# Patient Record
Sex: Male | Born: 1940 | Race: White | Hispanic: No | Smoking: Former smoker
Health system: Southern US, Community
[De-identification: ages and names within clinical notes are randomized; demographics above are authoritative.]

## PROBLEM LIST (undated history)

## (undated) DIAGNOSIS — I10 Essential (primary) hypertension: Secondary | ICD-10-CM

## (undated) DIAGNOSIS — F419 Anxiety disorder, unspecified: Secondary | ICD-10-CM

## (undated) DIAGNOSIS — M545 Low back pain: Secondary | ICD-10-CM

## (undated) DIAGNOSIS — E7800 Pure hypercholesterolemia, unspecified: Secondary | ICD-10-CM

## (undated) DIAGNOSIS — H409 Unspecified glaucoma: Secondary | ICD-10-CM

## (undated) DIAGNOSIS — R569 Unspecified convulsions: Secondary | ICD-10-CM

## (undated) DIAGNOSIS — F1010 Alcohol abuse, uncomplicated: Secondary | ICD-10-CM

## (undated) DIAGNOSIS — F329 Major depressive disorder, single episode, unspecified: Secondary | ICD-10-CM

## (undated) DIAGNOSIS — F32A Depression, unspecified: Secondary | ICD-10-CM

## (undated) DIAGNOSIS — G8929 Other chronic pain: Secondary | ICD-10-CM

## (undated) DIAGNOSIS — K219 Gastro-esophageal reflux disease without esophagitis: Secondary | ICD-10-CM

## (undated) DIAGNOSIS — R413 Other amnesia: Secondary | ICD-10-CM

## (undated) HISTORY — PX: POLYPECTOMY: SHX149

## (undated) HISTORY — PX: GLAUCOMA SURGERY: SHX656

## (undated) HISTORY — PX: TONSILLECTOMY: SUR1361

## (undated) HISTORY — DX: Other amnesia: R41.3

## (undated) HISTORY — PX: CATARACT EXTRACTION W/ INTRAOCULAR LENS  IMPLANT, BILATERAL: SHX1307

## (undated) HISTORY — PX: UPPER GI ENDOSCOPY: SHX6162

---

## 2007-05-09 ENCOUNTER — Emergency Department (HOSPITAL_COMMUNITY): Admission: EM | Admit: 2007-05-09 | Discharge: 2007-05-09 | Payer: Self-pay | Admitting: Emergency Medicine

## 2010-03-21 ENCOUNTER — Encounter: Admission: RE | Admit: 2010-03-21 | Discharge: 2010-03-21 | Payer: Self-pay | Admitting: Family Medicine

## 2014-07-24 ENCOUNTER — Encounter (HOSPITAL_COMMUNITY): Payer: Self-pay | Admitting: Emergency Medicine

## 2014-07-24 ENCOUNTER — Emergency Department (HOSPITAL_COMMUNITY)
Admission: EM | Admit: 2014-07-24 | Discharge: 2014-07-24 | Disposition: A | Discharge status: Home / Self Care | Payer: Medicare Other | Attending: Emergency Medicine | Admitting: Emergency Medicine

## 2014-07-24 DIAGNOSIS — Z8669 Personal history of other diseases of the nervous system and sense organs: Secondary | ICD-10-CM | POA: Insufficient documentation

## 2014-07-24 DIAGNOSIS — M791 Myalgia: Secondary | ICD-10-CM | POA: Insufficient documentation

## 2014-07-24 DIAGNOSIS — I10 Essential (primary) hypertension: Secondary | ICD-10-CM | POA: Diagnosis not present

## 2014-07-24 DIAGNOSIS — K5900 Constipation, unspecified: Secondary | ICD-10-CM | POA: Insufficient documentation

## 2014-07-24 DIAGNOSIS — Z8639 Personal history of other endocrine, nutritional and metabolic disease: Secondary | ICD-10-CM | POA: Insufficient documentation

## 2014-07-24 DIAGNOSIS — B029 Zoster without complications: Secondary | ICD-10-CM | POA: Diagnosis not present

## 2014-07-24 DIAGNOSIS — R21 Rash and other nonspecific skin eruption: Secondary | ICD-10-CM | POA: Diagnosis present

## 2014-07-24 HISTORY — DX: Pure hypercholesterolemia, unspecified: E78.00

## 2014-07-24 HISTORY — DX: Essential (primary) hypertension: I10

## 2014-07-24 HISTORY — DX: Unspecified glaucoma: H40.9

## 2014-07-24 MED ORDER — POLYETHYLENE GLYCOL 3350 17 GM/SCOOP PO POWD: 17.0000 g | Freq: Two times a day (BID) | ORAL | Status: DC

## 2014-07-24 MED ORDER — OXYCODONE-ACETAMINOPHEN 5-325 MG PO TABS: 2.0000 | ORAL_TABLET | Freq: Four times a day (QID) | ORAL | Status: DC | PRN

## 2014-07-24 NOTE — ED Provider Notes (Signed)
CSN: [id number]     Arrival date & time 07/24/14  [id number] History  This chart was scribed for non-physician practitioner, Roxy Horsemanobert Terease Marcotte, PA-C, working with Raeford RazorStephen Kohut, MD by Charline BillsEssence Howell, ED Scribe. This patient was seen in room TR07C/TR07C and the patient's care was started at 9:36 AM.   Chief Complaint  Patient presents with  . Rash   The history is provided by the patient. No language interpreter was used.   HPI Comments: Tommy Jensen is a 73 y.o. male, with a h/o HTN, hypercholesteremia, glaucoma, who presents to the Emergency Department with a chief complaint of gradually improving rash to L leg first noted 1 week ago. He describes the rash as red blisters that have burst. Pt states that rash started a few days after he experienced constant leg pain 10 days ago. Pt reports lack of sleep due to severity of pain. Pt states that he was seen by his PCP who suspected sciatica and prescribed him Hydrocodone which has provided temporary relief. Pt has also tried Extra Strength Tylenol without relief. He also reports constipation; last BM 4-5 days ago. Pt has tried consuming dried prunes daily without relief.   Past Medical History  Diagnosis Date  . Hypertension   . Hypercholesteremia   . Glaucoma    History reviewed. No pertinent past surgical history. History reviewed. No pertinent family history. History  Substance Use Topics  . Smoking status: Never Smoker   . Smokeless tobacco: Not on file  . Alcohol Use: No    Review of Systems  Gastrointestinal: Positive for constipation.  Musculoskeletal: Positive for myalgias.  Skin: Positive for rash.  All other systems reviewed and are negative.  Allergies  Ace inhibitors; Augmentin; Lovastatin; Pravastatin; and Wellbutrin  Home Medications   Prior to Admission medications   Not on File   Triage Vitals: BP 167/104 mmHg  Pulse 100  Temp(Src) 97.7 F (36.5 C) (Oral)  Resp 18  SpO2 99% Physical Exam  Constitutional: He is  oriented to person, place, and time. He appears well-developed and well-nourished. No distress.  HENT:  Head: Normocephalic and atraumatic.  Eyes: Conjunctivae and EOM are normal.  Neck: Neck supple.  Pulmonary/Chest: Effort normal. No respiratory distress.  Musculoskeletal: Normal range of motion.  Neurological: He is alert and oriented to person, place, and time.  Skin: Skin is warm and dry. Rash noted. Rash is vesicular.  Vesicular rash along the L3 dermatome consistent with shingles. Rash has scabbed and crusted over. No weeping or oozing.   Psychiatric: He has a normal mood and affect. His behavior is normal.  Nursing note and vitals reviewed.  ED Course  Procedures (including critical care time) DIAGNOSTIC STUDIES: Oxygen Saturation is 99% on RA, normal by my interpretation.    COORDINATION OF CARE: 9:48 AM-Discussed treatment plan which includes medication for pain. Also suggested getting the shingles vaccine and following up with PCP with pt at bedside and pt agreed to plan.   Labs Review Labs Reviewed - No data to display  Imaging Review No results found.   EKG Interpretation None      MDM   Final diagnoses:  Shingles   Patient with shingles.  Lesions have crusted over.  DC to home with pain meds.  Discussed with Dr. Juleen ChinaKohut, who agrees with the plan. I personally performed the services described in this documentation, which was scribed in my presence. The recorded information has been reviewed and is accurate.    Roxy Horsemanobert Gordon Vandunk, PA-C 07/24/14 1544  Raeford RazorStephen Kohut, MD 07/25/14 [phone]

## 2014-07-24 NOTE — ED Notes (Signed)
Pt c/o rash to left leg x 1 week that was blistered and now improved but still having pain

## 2014-07-24 NOTE — Discharge Instructions (Signed)
Shingles °Shingles (herpes zoster) is an infection that is caused by the same virus that causes chickenpox (varicella). The infection causes a painful skin rash and fluid-filled blisters, which eventually break open, crust over, and heal. It may occur in any area of the body, but it usually affects only one side of the body or face. The pain of shingles usually lasts about 1 month. However, some people with shingles may develop long-term (chronic) pain in the affected area of the body. °Shingles often occurs many years after the person had chickenpox. It is more common: °· In people older than 50 years. °· In people with weakened immune systems, such as those with HIV, AIDS, or cancer. °· In people taking medicines that weaken the immune system, such as transplant medicines. °· In people under great stress. °CAUSES  °Shingles is caused by the varicella zoster virus (VZV), which also causes chickenpox. After a person is infected with the virus, it can remain in the person's body for years in an inactive state (dormant). To cause shingles, the virus reactivates and breaks out as an infection in a nerve root. °The virus can be spread from person to person (contagious) through contact with open blisters of the shingles rash. It will only spread to people who have not had chickenpox. When these people are exposed to the virus, they may develop chickenpox. They will not develop shingles. Once the blisters scab over, the person is no longer contagious and cannot spread the virus to others. °SIGNS AND SYMPTOMS  °Shingles shows up in stages. The initial symptoms may be pain, itching, and tingling in an area of the skin. This pain is usually described as burning, stabbing, or throbbing. In a few days or weeks, a painful red rash will appear in the area where the pain, itching, and tingling were felt. The rash is usually on one side of the body in a band or belt-like pattern. Then, the rash usually turns into fluid-filled  blisters. They will scab over and dry up in approximately 2-3 weeks. °Flu-like symptoms may also occur with the initial symptoms, the rash, or the blisters. These may include: °· Fever. °· Chills. °· Headache. °· Upset stomach. °DIAGNOSIS  °Your health care provider will perform a skin exam to diagnose shingles. Skin scrapings or fluid samples may also be taken from the blisters. This sample will be examined under a microscope or sent to a lab for further testing. °TREATMENT  °There is no specific cure for shingles. Your health care provider will likely prescribe medicines to help you manage the pain, recover faster, and avoid long-term problems. This may include antiviral drugs, anti-inflammatory drugs, and pain medicines. °HOME CARE INSTRUCTIONS  °· Take a cool bath or apply cool compresses to the area of the rash or blisters as directed. This may help with the pain and itching.   °· Take medicines only as directed by your health care provider.   °· Rest as directed by your health care provider. °· Keep your rash and blisters clean with mild soap and cool water or as directed by your health care provider.  °· Do not pick your blisters or scratch your rash. Apply an anti-itch cream or numbing creams to the affected area as directed by your health care provider. °· Keep your shingles rash covered with a loose bandage (dressing). °· Avoid skin contact with: °· Babies.   °· Pregnant women.   °· Children with eczema.   °· Elderly people with transplants.   °· People with chronic illnesses, such as leukemia   or AIDS.   °· Wear loose-fitting clothing to help ease the pain of material rubbing against the rash. °· Keep all follow-up visits as directed by your health care provider. If the area involved is on your face, you may receive a referral for a specialist, such as an eye doctor (ophthalmologist) or an ear, nose, and throat (ENT) doctor. Keeping all follow-up visits will help you avoid eye problems, chronic pain, or  disability.   °SEEK IMMEDIATE MEDICAL CARE IF:  °· You have facial pain, pain around the eye area, or loss of feeling on one side of your face. °· You have ear pain or ringing in your ear. °· You have loss of taste. °· Your pain is not relieved with prescribed medicines.   °· Your redness or swelling spreads.   °· You have more pain and swelling.  °· Your condition is worsening or has changed.   °· You have a fever. °MAKE SURE YOU: °· Understand these instructions. °· Will watch your condition. °· Will get help right away if you are not doing well or get worse. °Document Released: 09/01/2005 Document Revised: 01/16/2014 Document Reviewed: 04/15/2012 °[employer]® Patient Information ©2015 [employer]. This information is not intended to replace advice given to you by your health care provider. Make sure you discuss any questions you have with your health care provider. ° °Neuropathic Pain °We often think that pain has a physical cause. If we get rid of the cause, the pain should go away. Nerves themselves can also cause pain. It is called neuropathic pain, which means nerve abnormality. It may be difficult for the patients who have it and for the treating caregivers. Pain is usually described as acute (short-lived) or chronic (long-lasting). Acute pain is related to the physical sensations caused by an injury. It can last from a few seconds to many weeks, but it usually goes away when normal healing occurs. Chronic pain lasts beyond the typical healing time. With neuropathic pain, the nerve fibers themselves may be damaged or injured. They then send incorrect signals to other pain centers. The pain you feel is real, but the cause is not easy to find.  °CAUSES  °Chronic pain can result from diseases, such as diabetes and shingles (an infection related to chickenpox), or from trauma, surgery, or amputation. It can also happen without any known injury or disease. The nerves are sending pain messages, even though there  is no identifiable cause for such messages.  °· Other common causes of neuropathy include diabetes, phantom limb pain, or Regional Pain Syndrome (RPS). °· As with all forms of chronic back pain, if neuropathy is not correctly treated, there can be a number of associated problems that lead to a downward cycle for the patient. These include depression, sleeplessness, feelings of fear and anxiety, limited social interaction and inability to do normal daily activities or work. °· The most dramatic and mysterious example of neuropathic pain is called "phantom limb syndrome." This occurs when an arm or a leg has been removed because of illness or injury. The brain still gets pain messages from the nerves that originally carried impulses from the missing limb. These nerves now seem to misfire and cause troubling pain. °· Neuropathic pain often seems to have no cause. It responds poorly to standard pain treatment. °Neuropathic pain can occur after: °· Shingles (herpes zoster virus infection). °· A lasting burning sensation of the skin, caused usually by injury to a peripheral nerve. °· Peripheral neuropathy which is widespread nerve damage, often caused by diabetes or   alcoholism. °· Phantom limb pain following an amputation. °· Facial nerve problems (trigeminal neuralgia). °· Multiple sclerosis. °· Reflex sympathetic dystrophy. °· Pain which comes with cancer and cancer chemotherapy. °· Entrapment neuropathy such as when pressure is put on a nerve such as in carpal tunnel syndrome. °· Back, leg, and hip problems (sciatica). °· Spine or back surgery. °· HIV Infection or AIDS where nerves are infected by viruses. °Your caregiver can explain items in the above list which may apply to you. °SYMPTOMS  °Characteristics of neuropathic pain are: °· Severe, sharp, electric shock-like, shooting, lightening-like, knife-like. °· Pins and needles sensation. °· Deep burning, deep cold, or deep ache. °· Persistent numbness, tingling, or  weakness. °· Pain resulting from light touch or other stimulus that would not usually cause pain. °· Increased sensitivity to something that would normally cause pain, such as a pinprick. °Pain may persist for months or years following the healing of damaged tissues. When this happens, pain signals no longer sound an alarm about current injuries or injuries about to happen. Instead, the alarm system itself is not working correctly.  °Neuropathic pain may get worse instead of better over time. For some people, it can lead to serious disability. It is important to be aware that severe injury in a limb can occur without a proper, protective pain response. Burns, cuts, and other injuries may go unnoticed. Without proper treatment, these injuries can become infected or lead to further disability. Take any injury seriously, and consult your caregiver for treatment. °DIAGNOSIS  °When you have a pain with no known cause, your caregiver will probably ask some specific questions:  °· Do you have any other conditions, such as diabetes, shingles, multiple sclerosis, or HIV infection? °· How would you describe your pain? (Neuropathic pain is often described as shooting, stabbing, burning, or searing.) °· Is your pain worse at any time of the day? (Neuropathic pain is usually worse at night.) °· Does the pain seem to follow a certain physical pathway? °· Does the pain come from an area that has missing or injured nerves? (An example would be phantom limb pain.) °· Is the pain triggered by minor things such as rubbing against the sheets at night? °These questions often help define the type of pain involved. Once your caregiver knows what is happening, treatment can begin. Anticonvulsant, antidepressant drugs, and various pain relievers seem to work in some cases. If another condition, such as diabetes is involved, better management of that disorder may relieve the neuropathic pain.  °TREATMENT  °Neuropathic pain is frequently  long-lasting and tends not to respond to treatment with narcotic type pain medication. It may respond well to other drugs such as antiseizure and antidepressant medications. Usually, neuropathic problems do not completely go away, but partial improvement is often possible with proper treatment. Your caregivers have large numbers of medications available to treat you. Do not be discouraged if you do not get immediate relief. Sometimes different medications or a combination of medications will be tried before you receive the results you are hoping for. See your caregiver if you have pain that seems to be coming from nowhere and does not go away. Help is available.  °SEEK IMMEDIATE MEDICAL CARE IF:  °· There is a sudden change in the quality of your pain, especially if the change is on only one side of the body. °· You notice changes of the skin, such as redness, black or purple discoloration, swelling, or an ulcer. °· You cannot move the affected   limbs. °Document Released: 05/29/2004 Document Revised: 11/24/2011 Document Reviewed: 05/29/2004 °[employer]® Patient Information ©2015 [employer]. This information is not intended to replace advice given to you by your health care provider. Make sure you discuss any questions you have with your health care provider. ° °

## 2014-08-04 ENCOUNTER — Emergency Department (HOSPITAL_COMMUNITY)
Admission: EM | Admit: 2014-08-04 | Discharge: 2014-08-04 | Disposition: A | Discharge status: Home / Self Care | Payer: Medicare Other | Attending: Emergency Medicine | Admitting: Emergency Medicine

## 2014-08-04 ENCOUNTER — Encounter (HOSPITAL_COMMUNITY): Payer: Self-pay | Admitting: *Deleted

## 2014-08-04 DIAGNOSIS — Z8639 Personal history of other endocrine, nutritional and metabolic disease: Secondary | ICD-10-CM | POA: Insufficient documentation

## 2014-08-04 DIAGNOSIS — H409 Unspecified glaucoma: Secondary | ICD-10-CM | POA: Insufficient documentation

## 2014-08-04 DIAGNOSIS — Z79899 Other long term (current) drug therapy: Secondary | ICD-10-CM | POA: Diagnosis not present

## 2014-08-04 DIAGNOSIS — B0229 Other postherpetic nervous system involvement: Secondary | ICD-10-CM | POA: Diagnosis not present

## 2014-08-04 DIAGNOSIS — I10 Essential (primary) hypertension: Secondary | ICD-10-CM | POA: Diagnosis not present

## 2014-08-04 DIAGNOSIS — R21 Rash and other nonspecific skin eruption: Secondary | ICD-10-CM | POA: Diagnosis present

## 2014-08-04 MED ORDER — OXYCODONE-ACETAMINOPHEN 5-325 MG PO TABS: 1.0000 | ORAL_TABLET | Freq: Four times a day (QID) | ORAL | Status: DC | PRN

## 2014-08-04 MED ORDER — GABAPENTIN 100 MG PO CAPS: 100.0000 mg | ORAL_CAPSULE | Freq: Three times a day (TID) | ORAL | Status: DC

## 2014-08-04 NOTE — Discharge Instructions (Signed)
Postherpetic Neuralgia Postherpetic neuralgia (PHN) is nerve pain that occurs after a shingles infection. Shingles is a painful rash that appears on one side of the body, usually on your trunk or face. Shingles is caused by the varicella-zoster virus. This is the same virus that causes chickenpox. In people who have had chickenpox, the virus can resurface years later and cause shingles. You may have PHN if you continue to have pain for 3 months after your shingles rash has gone away. PHN appears in the same area where you had the shingles rash. For most people, PHN goes away within 1 year.  Getting a vaccination for shingles can prevent PHN. This vaccine is recommended for people older than 50. It may prevent shingles and may also lower your risk of PHN if you do get shingles. CAUSES PHN is caused by damage to your nerves from the varicella-zoster virus. This damage makes your nerves overly sensitive.  RISK FACTORS Aging is the biggest risk factor for developing PHN. Most people who get PHN are older than 60. Other risk factors include:  Having very bad pain before your shingles rash starts.  Having a very bad rash.  Having shingles in the nerve that supplies your face and eye (trigeminal nerve). SIGNS AND SYMPTOMS Pain is the main symptom of PHN. The pain is often very bad and may be described as stabbing, burning, or feeling like an electric shock. The pain may come and go or may be there all the time. Pain may be triggered by light touches on the skin or changes in temperature. You may have itching along with the pain. DIAGNOSIS  Your health care provider may diagnose PHN based on your symptoms and your history of shingles. Lab studies and other diagnostic tests are usually not needed. TREATMENT  There is no cure for PHN. Treatment for PHN will focus on pain relief. Over-the-counter pain relievers do not usually relieve PHN pain. You may need to work with a pain specialist. Treatment may  include:  Antidepressant medicines to help with pain and improve sleep.  Antiseizure medicines to relieve nerve pain.  Strong pain relievers (opioids).  A numbing patch worn on the skin (lidocaine patch). HOME CARE INSTRUCTIONS It may take a long time to recover from PHN. Work closely with your health care provider, and have a good support system at home.   Take all medicines as directed by your health care provider.  Wear loose, comfortable clothing.  Cover sensitive areas with a dressing to reduce friction from clothing rubbing on the area.  If cold does not make your pain worse, try applying a cool compress or cooling gel pack to the area.  Talk to your health care provider if you feel depressed or desperate. Living with long-term pain can be depressing. SEEK MEDICAL CARE IF:  Your medicine is not helping.  You are struggling to manage your pain at home. Document Released: 11/22/2002 Document Revised: 01/16/2014 Document Reviewed: 08/23/2013 [facility] Patient Information 2015 [employer], [place]. This information is not intended to replace advice given to you by your health care provider. Make sure you discuss any questions you have with your health care provider.   Emergency Department Resource Guide 1) Find a Doctor and Pay Out of Pocket Although you won't have to find out who is covered by your insurance plan, it is a good idea to ask around and get recommendations. You will then need to call the office and see if the doctor you have chosen will accept  you as a new patient and what types of options they offer for patients who are self-pay. Some doctors offer discounts or will set up payment plans for their patients who do not have insurance, but you will need to ask so you aren't surprised when you get to your appointment.  2) Contact Your Local Health Department Not all health departments have doctors that can see patients for sick visits, but many do, so it is worth a call to  see if yours does. If you don't know where your local health department is, you can check in your phone book. The CDC also has a tool to help you locate your state's health department, and many state websites also have listings of all of their local health departments.  3) Find a Walk-in Clinic If your illness is not likely to be very severe or complicated, you may want to try a walk in clinic. These are popping up all over the country in pharmacies, drugstores, and shopping centers. They're usually staffed by nurse practitioners or physician assistants that have been trained to treat common illnesses and complaints. They're usually fairly quick and inexpensive. However, if you have serious medical issues or chronic medical problems, these are probably not your best option.  No Primary Care Doctor: - Call Health Connect at  [phone] - they can help you locate a primary care doctor that  accepts your insurance, provides certain services, etc. - Physician Referral Service- [phone]  Chronic Pain Problems: Organization         Address  Phone   Notes  [facility]  [phone] Patients need to be referred by their primary care doctor.   Medication Assistance: Organization         Address  Phone   Notes  [facility] Medication [facility] [address] [place], [place] [address] [phone] --Must be a resident of [facility] [place] -- Must have NO insurance coverage whatsoever (no Medicaid/ Medicare, etc.) -- The pt. MUST have a primary care doctor that directs their care regularly and follows them in the community   MedAssist  [phone]   [employer]  [phone]    Agencies that provide inexpensive medical care: Organization         Address  Phone   Notes  [facility]  [phone]   [facility]    [phone]) [phone]   [facility] [address] [place], [place] [address] [phone])  [phone]   [facility] [address], [place] [phone]) [phone]   [facility]anned Parenthood    [phone]) [phone]   [facility]    [phone]   [facility]  [address], [place] Phone:  [phone](336) [phone], Fax:  [phone] Hours of Operation:  9 am - 6 pm, M-F.  Also accepts Medicaid/Medicare and self-pay.  [facility]  [address], [place] Phone: [phone], Fax: [phone]) [phone]. Hours of Operation:  8:30 am - 5:30 pm, M-F.  Also accepts Medicaid and self-pay.  [facility] [place] [address], [place] Phone: [phone](336) [phone]   [facility] [address], [place], [place] [phone], Ext. [phone] Mondays & Thursdays: 7-9 AM.  First 15 patients are seen on a first come, first serve basis.    Medicaid-accepting [facility] Providers:  Organization  Address  Phone   Notes  [facility] [address], [place] [phone] Also accepts self-pay patients.  [facility] [id number] [place], [place]  [phone]   [place], [address], [place] [phone]   [facility] [address] [place] [phone]   Lucianne Lei [address], [place]   [phone] Only accepts [place] Access [place] patients after they have their name applied to their card.   Self-Pay (no insurance) in [facility]:  Organization         Address  Phone   Notes  Sickle Cell Patients, [facility] [place] [phone]   [facility] [place] [phone]   [facility] [address]  [place], [place], [place] [phone]   [facility]/Dr. Osei-Bonsu  [address], [place] [address], [place] [phone] Phone number for both [place] and [place] locations is the same.  [facility] [address], [place] [phone]   [facility] [address] [place] or [address] [phone]   [facility] [address], [place] [phone], phone; [phone], fax Sees patients 1st and 3rd Saturday of every month.  Must not qualify for public or private insurance (i.e. Medicaid, Medicare, [place] Health Choice, Veterans' Benefits)  Household income should be no more than 200% of the poverty level The clinic cannot treat you if you are pregnant or think you are pregnant  Sexually transmitted diseases are not treated at the clinic.    Dental Care: Organization         Address  Phone  Notes  [facility] Department of [facility] [place] [phone] Accepts children up to age 47 who are enrolled in [place] or [place]; pregnant women with a Medicaid card; and children who have applied for Medicaid or [place] Health Choice, but were declined, whose parents can pay a reduced fee at time of service.  [facility] Department of [facility]  [address], [place] [phone] Accepts children up to age 35 who are enrolled in [place] or [place]; pregnant women with a Medicaid card; and children who have applied for Medicaid or [place] Health Choice, but were declined, whose parents can pay a reduced fee at time of service.  [facility] PRO[facility]  [place] [phone] Patients are seen by appointment only. Walk-ins are not accepted. [place] will see patients 65 years of age and older. Monday - Tuesday (8am-5pm) Most Wednesdays (8:30-5pm) $30 per visit, cash only  [facility] Adult Dental Access PROGRAM  [address], [facility] [phone] Patients are  seen by appointment only. Walk-ins are not accepted. [place] will see patients 70 years of age and older. One Wednesday Evening (Monthly: Volunteer Based).  $30 per visit, cash only  [place]  [phone] for adults; Children under age 53, call Graduate Pediatric Dentistry at [phone]. Children aged 69-14, please call [phone] to request a pediatric application.  Dental services are provided in all areas of dental care including  fillings, crowns and bridges, complete and partial dentures, implants, gum treatment, root canals, and extractions. Preventive care is also provided. Treatment is provided to both adults and children. Patients are selected via a lottery and there is often a waiting list.   [facility] [address] [place]  [phone] [email or web address]   [facility] [address] [place], [place] [phone], Ext. [phone] Second and Fourth Thursday of each month, opens at 6:30 AM; Clinic ends at 9 AM.  Patients are seen on a first-come first-served basis, and a limited number are seen during each clinic.   [facility]  [address] Hillard Danker [place], [place] [phone]   Eligibility Requirements You must have lived in [place], [place], or [place] counties for at least the last three months.   You cannot be eligible for state or federal sponsored [employer], including [employer], [place], or [employer].   You generally cannot be eligible for healthcare insurance through your employer.    How to apply: Eligibility screenings are held every Tuesday and Wednesday afternoon from 1:00 pm until 4:00 pm. You do not need an appointment for the interview!  [facility] [address], [place], [place]   [place]  [place] Department  [place]  [phone]     Behavioral Health Resources in the Community: Intensive Outpatient Programs Organization         Address  Phone  Notes  [place]. [address] [place], [place] [phone]   [facility] Outpatient [address], [place], [place]   ADS: Alcohol & Drug Svcs [address], [place], [place]   [place] [address]  [place], [place] or [phone]   Substance Abuse Resources Organization         Address  Phone  Notes  Alcohol [facility] Drug Services  [phone]   [place]  [phone]   [facility] [place]   Chinita Pester  [phone]   Residential & Outpatient Substance Abuse Program  [phone]   Psychological Services Organization         Address  Phone  Notes  [facility]  [place]  [phone]   [place] [address], [place] or [phone]    Mobile Crisis Teams Organization         Address  Phone  Notes  [facility], Mobile Crisis Care Unit  [phone]   [facility]  [address] [place], [place]   [place] [address], [place] [phone]    Self-Help/Support Groups Organization         Address  Phone             Notes  [place]. of [place] - variety of support groups  [place] Call for more information  Narcotics Anonymous (NA), [facility] [address], [employer] [place]  2 meetings at this location   Special educational needs teacher         Address  Phone  Notes  [facility] [place],    [place]  [phone]   [facility]  [address] [place] 867619, [place], [place]   [place], [place] [phone] Admissions: 8am-3pm M-F  Incentives  [facility] [address]    [place], [place] [phone]   [facility] [address], [place], [place] [phone]   [facility] [address]  [place], [place] [phone]   [facility] - Intensive Outpatient [address], [place], [place] [phone]   [facility] ([facility] [address]  [place], [place] [phone] or [phone]   [facility] (RTS) [address] [place], [place] [phone] Accepts Medicaid  [facility] [place] [address]  [place] [phone] Substance Abuse/Addiction Treatment   [facility] Behavioral Health Resources Organization         Address  Phone  Notes  [facility]  [phone]   Angie FavaJulie Brannon, PhD [address], [address] [place], [place]   [phone]) [phone] or [phone]) [phone]   [facility]   [address] [place], [place] [phone](336) [phone]   [facility] [address], [place], [place] [phone]) [phone] Insurance/Medicaid/sponsorship through [facility]  [facility] Families [address]                                    [place], [place] [phone] Therapy/tele-psych/case  [facility] [address].   [place], [place] [phone](336) [phone]    Dr. Lolly MustacheArfeen  [phone]) [phone]   [facility]  [facility] 1) [address], [place] 2) [address], [place] 3)  [address], [place] [phone]) [phone]  [phone](336) [phone]   [facility] Child Abuse Hotline [phone]) [phone] or [phone]) [phone] (After Hours)

## 2014-08-04 NOTE — ED Provider Notes (Signed)
CSN: [id number]     Arrival date & time 08/04/14  1243 History  This chart was scribed for non-physician practitioner, Terri Piedraourtney Forcucci, PA-C,working with Gwyneth SproutWhitney Plunkett, MD, by Karle PlumberJennifer Tensley, ED Scribe. This patient was seen in room TR06C/TR06C and the patient's care was started at 1:23 PM.  Chief Complaint  Patient presents with  . Rash   Patient is a 73 y.o. male presenting with rash. The history is provided by the patient. No language interpreter was used.  Rash Associated symptoms: no fever, no nausea and not vomiting     HPI Comments:  Tommy Jensen is a 73 y.o. male with PMH of HTN, hypercholesteremia and glaucoma who presents to the Emergency Department complaining of a mild painful rash to the LLE that began almost three weeks ago. He reports the rash is well healing and clearing up well, but still reports constant severe burning pain. He reports he was seen here 11 days ago and was diagnosed with shingles but is now out of pain medication. He was treated with Percocet in which he has been taking less than prescribed to make it last longer. He reports he is currently looking for a new PCP. He denies weeping or drainage, fever, chills, nausea or vomiting.   Past Medical History  Diagnosis Date  . Hypertension   . Hypercholesteremia   . Glaucoma    History reviewed. No pertinent past surgical history. History reviewed. No pertinent family history. History  Substance Use Topics  . Smoking status: Never Smoker   . Smokeless tobacco: Not on file  . Alcohol Use: No    Review of Systems  Constitutional: Negative for fever and chills.  Gastrointestinal: Negative for nausea and vomiting.  Skin: Positive for rash.  All other systems reviewed and are negative.  Allergies  Ace inhibitors; Augmentin; Lovastatin; Pravastatin; and Wellbutrin  Home Medications   Prior to Admission medications   Medication Sig Start Date End Date Taking? Authorizing Provider  gabapentin  (NEURONTIN) 100 MG capsule Take 1 capsule (100 mg total) by mouth 3 (three) times daily. 08/04/14   Aquan Kope A Forcucci, PA-C  oxyCODONE-acetaminophen (PERCOCET/ROXICET) 5-325 MG per tablet Take 1-2 tablets by mouth every 6 (six) hours as needed for moderate pain or severe pain. 08/04/14   Sai Zinn A Forcucci, PA-C  polyethylene glycol powder (GLYCOLAX/MIRALAX) powder Take 17 g by mouth 2 (two) times daily. Until daily soft stools  OTC 07/24/14   Roxy Horsemanobert Browning, PA-C   Triage Vitals: BP 141/80 mmHg  Pulse 84  Temp(Src) 98.6 F (37 C) (Oral)  Resp 18  SpO2 96% Physical Exam  Constitutional: He is oriented to person, place, and time. He appears well-developed and well-nourished.  HENT:  Head: Normocephalic and atraumatic.  Eyes: EOM are normal.  Neck: Normal range of motion.  Cardiovascular: Normal rate, regular rhythm and normal heart sounds.  Exam reveals no gallop and no friction rub.   No murmur heard. Pulses:      Dorsalis pedis pulses are 2+ on the right side, and 2+ on the left side.  Pulmonary/Chest: Effort normal and breath sounds normal. No respiratory distress. He has no wheezes. He has no rales.  Musculoskeletal: Normal range of motion.  Neurological: He is alert and oriented to person, place, and time.  Skin: Skin is warm and dry.  Well healing, scabbed papules to the left thigh with no evidence of vesicles. No petechiae or purpura.  Psychiatric: He has a normal mood and affect. His behavior is normal.  Nursing note and vitals reviewed.   ED Course  Procedures (including critical care time) DIAGNOSTIC STUDIES: Oxygen Saturation is 96% on RA, adequate by my interpretation.   COORDINATION OF CARE: 1:31 PM- Will prescribe Percocet and Gabapentin. Will provide resource guide to set up care with a new PCP. Pt verbalizes understanding and agrees to plan.  Medications - No data to display  Labs Review Labs Reviewed - No data to display  Imaging Review No results  found.   EKG Interpretation None      MDM   Final diagnoses:  Post herpetic neuralgia    Patient is a 73 year old male who presents to the emergency room with left leg pain after she goes diagnoses on 07/24/2014. Patient has been taking oxycodone at home, but states he ran out of his prescription. Rash appears to be healing well. There are no signs of infection. Vital signs are stable. Will discharge patient home with gabapentin and oxycodone for pain. We'll also provide resource list so patient can be set up with new PCP. Patient was also seen by Dr. Anitra LauthPlunkett who agrees with the above plan and workup. Patient is stable for discharge at this time.  I personally performed the services described in this documentation, which was scribed in my presence. The recorded information has been reviewed and is accurate.    Eben Burowourtney A Forcucci, PA-C 08/04/14 1416  Gwyneth SproutWhitney Plunkett, MD 08/04/14 [phone]

## 2014-08-04 NOTE — ED Notes (Signed)
Pt seen on 11/9 for same, has painful rash to left leg and was diagnosed with shingles, pt is now out of pain medications. No acute distress noted at triage.

## 2014-11-23 DIAGNOSIS — H26492 Other secondary cataract, left eye: Secondary | ICD-10-CM | POA: Diagnosis not present

## 2014-11-23 DIAGNOSIS — Z961 Presence of intraocular lens: Secondary | ICD-10-CM | POA: Diagnosis not present

## 2014-11-23 DIAGNOSIS — H4011X2 Primary open-angle glaucoma, moderate stage: Secondary | ICD-10-CM | POA: Diagnosis not present

## 2014-11-23 DIAGNOSIS — H4011X1 Primary open-angle glaucoma, mild stage: Secondary | ICD-10-CM | POA: Diagnosis not present

## 2014-12-15 DIAGNOSIS — R351 Nocturia: Secondary | ICD-10-CM | POA: Diagnosis not present

## 2014-12-15 DIAGNOSIS — B0223 Postherpetic polyneuropathy: Secondary | ICD-10-CM | POA: Diagnosis not present

## 2014-12-15 DIAGNOSIS — N529 Male erectile dysfunction, unspecified: Secondary | ICD-10-CM | POA: Diagnosis not present

## 2014-12-15 DIAGNOSIS — F329 Major depressive disorder, single episode, unspecified: Secondary | ICD-10-CM | POA: Diagnosis not present

## 2015-01-26 DIAGNOSIS — N529 Male erectile dysfunction, unspecified: Secondary | ICD-10-CM | POA: Diagnosis not present

## 2015-01-26 DIAGNOSIS — F329 Major depressive disorder, single episode, unspecified: Secondary | ICD-10-CM | POA: Diagnosis not present

## 2015-01-26 DIAGNOSIS — I10 Essential (primary) hypertension: Secondary | ICD-10-CM | POA: Diagnosis not present

## 2015-01-26 DIAGNOSIS — E782 Mixed hyperlipidemia: Secondary | ICD-10-CM | POA: Diagnosis not present

## 2015-05-29 DIAGNOSIS — H26492 Other secondary cataract, left eye: Secondary | ICD-10-CM | POA: Diagnosis not present

## 2015-05-29 DIAGNOSIS — Z961 Presence of intraocular lens: Secondary | ICD-10-CM | POA: Diagnosis not present

## 2015-05-29 DIAGNOSIS — H4011X1 Primary open-angle glaucoma, mild stage: Secondary | ICD-10-CM | POA: Diagnosis not present

## 2015-05-29 DIAGNOSIS — H4011X2 Primary open-angle glaucoma, moderate stage: Secondary | ICD-10-CM | POA: Diagnosis not present

## 2015-07-05 DIAGNOSIS — F321 Major depressive disorder, single episode, moderate: Secondary | ICD-10-CM | POA: Diagnosis not present

## 2015-07-05 DIAGNOSIS — Z23 Encounter for immunization: Secondary | ICD-10-CM | POA: Diagnosis not present

## 2015-07-05 DIAGNOSIS — I10 Essential (primary) hypertension: Secondary | ICD-10-CM | POA: Diagnosis not present

## 2015-07-05 DIAGNOSIS — N529 Male erectile dysfunction, unspecified: Secondary | ICD-10-CM | POA: Diagnosis not present

## 2015-07-05 DIAGNOSIS — E782 Mixed hyperlipidemia: Secondary | ICD-10-CM | POA: Diagnosis not present

## 2015-07-12 DIAGNOSIS — F321 Major depressive disorder, single episode, moderate: Secondary | ICD-10-CM | POA: Diagnosis not present

## 2015-07-12 DIAGNOSIS — I10 Essential (primary) hypertension: Secondary | ICD-10-CM | POA: Diagnosis not present

## 2015-07-12 DIAGNOSIS — E782 Mixed hyperlipidemia: Secondary | ICD-10-CM | POA: Diagnosis not present

## 2015-07-12 DIAGNOSIS — K210 Gastro-esophageal reflux disease with esophagitis: Secondary | ICD-10-CM | POA: Diagnosis not present

## 2015-07-24 DIAGNOSIS — H903 Sensorineural hearing loss, bilateral: Secondary | ICD-10-CM | POA: Diagnosis not present

## 2015-12-04 DIAGNOSIS — Z961 Presence of intraocular lens: Secondary | ICD-10-CM | POA: Diagnosis not present

## 2015-12-04 DIAGNOSIS — H401121 Primary open-angle glaucoma, left eye, mild stage: Secondary | ICD-10-CM | POA: Diagnosis not present

## 2015-12-04 DIAGNOSIS — H401113 Primary open-angle glaucoma, right eye, severe stage: Secondary | ICD-10-CM | POA: Diagnosis not present

## 2015-12-27 DIAGNOSIS — E782 Mixed hyperlipidemia: Secondary | ICD-10-CM | POA: Diagnosis not present

## 2015-12-27 DIAGNOSIS — I10 Essential (primary) hypertension: Secondary | ICD-10-CM | POA: Diagnosis not present

## 2016-01-03 DIAGNOSIS — E782 Mixed hyperlipidemia: Secondary | ICD-10-CM | POA: Diagnosis not present

## 2016-01-03 DIAGNOSIS — I10 Essential (primary) hypertension: Secondary | ICD-10-CM | POA: Diagnosis not present

## 2016-05-29 DIAGNOSIS — H401121 Primary open-angle glaucoma, left eye, mild stage: Secondary | ICD-10-CM | POA: Diagnosis not present

## 2016-05-29 DIAGNOSIS — H401113 Primary open-angle glaucoma, right eye, severe stage: Secondary | ICD-10-CM | POA: Diagnosis not present

## 2016-06-13 DIAGNOSIS — I10 Essential (primary) hypertension: Secondary | ICD-10-CM | POA: Diagnosis not present

## 2016-06-13 DIAGNOSIS — Z87891 Personal history of nicotine dependence: Secondary | ICD-10-CM | POA: Diagnosis not present

## 2016-06-13 DIAGNOSIS — H409 Unspecified glaucoma: Secondary | ICD-10-CM | POA: Diagnosis not present

## 2016-06-26 DIAGNOSIS — H401121 Primary open-angle glaucoma, left eye, mild stage: Secondary | ICD-10-CM | POA: Diagnosis not present

## 2016-06-26 DIAGNOSIS — H401113 Primary open-angle glaucoma, right eye, severe stage: Secondary | ICD-10-CM | POA: Diagnosis not present

## 2016-07-17 DIAGNOSIS — I10 Essential (primary) hypertension: Secondary | ICD-10-CM | POA: Diagnosis not present

## 2016-07-17 DIAGNOSIS — Z0000 Encounter for general adult medical examination without abnormal findings: Secondary | ICD-10-CM | POA: Diagnosis not present

## 2016-07-17 DIAGNOSIS — Z23 Encounter for immunization: Secondary | ICD-10-CM | POA: Diagnosis not present

## 2016-07-17 DIAGNOSIS — E782 Mixed hyperlipidemia: Secondary | ICD-10-CM | POA: Diagnosis not present

## 2016-07-24 DIAGNOSIS — I10 Essential (primary) hypertension: Secondary | ICD-10-CM | POA: Diagnosis not present

## 2016-07-24 DIAGNOSIS — E782 Mixed hyperlipidemia: Secondary | ICD-10-CM | POA: Diagnosis not present

## 2016-09-01 DIAGNOSIS — S63286A Dislocation of proximal interphalangeal joint of right little finger, initial encounter: Secondary | ICD-10-CM | POA: Diagnosis not present

## 2016-09-01 DIAGNOSIS — S6990XA Unspecified injury of unspecified wrist, hand and finger(s), initial encounter: Secondary | ICD-10-CM | POA: Diagnosis not present

## 2016-09-01 DIAGNOSIS — G8911 Acute pain due to trauma: Secondary | ICD-10-CM | POA: Diagnosis not present

## 2016-09-01 DIAGNOSIS — S63254A Unspecified dislocation of right ring finger, initial encounter: Secondary | ICD-10-CM | POA: Diagnosis not present

## 2016-09-01 DIAGNOSIS — S63284A Dislocation of proximal interphalangeal joint of right ring finger, initial encounter: Secondary | ICD-10-CM | POA: Diagnosis not present

## 2016-09-26 DIAGNOSIS — H401121 Primary open-angle glaucoma, left eye, mild stage: Secondary | ICD-10-CM | POA: Diagnosis not present

## 2016-09-26 DIAGNOSIS — H401113 Primary open-angle glaucoma, right eye, severe stage: Secondary | ICD-10-CM | POA: Diagnosis not present

## 2017-01-08 DIAGNOSIS — K210 Gastro-esophageal reflux disease with esophagitis: Secondary | ICD-10-CM | POA: Diagnosis not present

## 2017-01-08 DIAGNOSIS — R1310 Dysphagia, unspecified: Secondary | ICD-10-CM | POA: Diagnosis not present

## 2017-01-15 ENCOUNTER — Other Ambulatory Visit: Payer: Self-pay | Admitting: Internal Medicine

## 2017-01-15 DIAGNOSIS — K2100 Gastro-esophageal reflux disease with esophagitis, without bleeding: Secondary | ICD-10-CM

## 2017-01-16 ENCOUNTER — Ambulatory Visit
Admission: RE | Admit: 2017-01-16 | Discharge: 2017-01-16 | Disposition: A | Discharge status: Home / Self Care | Payer: Medicare Other | Source: Ambulatory Visit | Attending: Internal Medicine | Admitting: Internal Medicine

## 2017-01-16 DIAGNOSIS — K449 Diaphragmatic hernia without obstruction or gangrene: Secondary | ICD-10-CM | POA: Diagnosis not present

## 2017-01-16 DIAGNOSIS — K2100 Gastro-esophageal reflux disease with esophagitis, without bleeding: Secondary | ICD-10-CM

## 2017-01-22 DIAGNOSIS — I10 Essential (primary) hypertension: Secondary | ICD-10-CM | POA: Diagnosis not present

## 2017-01-22 DIAGNOSIS — E782 Mixed hyperlipidemia: Secondary | ICD-10-CM | POA: Diagnosis not present

## 2017-01-29 DIAGNOSIS — E782 Mixed hyperlipidemia: Secondary | ICD-10-CM | POA: Diagnosis not present

## 2017-01-29 DIAGNOSIS — K5730 Diverticulosis of large intestine without perforation or abscess without bleeding: Secondary | ICD-10-CM | POA: Diagnosis not present

## 2017-01-29 DIAGNOSIS — I10 Essential (primary) hypertension: Secondary | ICD-10-CM | POA: Diagnosis not present

## 2017-03-27 DIAGNOSIS — H401113 Primary open-angle glaucoma, right eye, severe stage: Secondary | ICD-10-CM | POA: Diagnosis not present

## 2017-03-27 DIAGNOSIS — H401121 Primary open-angle glaucoma, left eye, mild stage: Secondary | ICD-10-CM | POA: Diagnosis not present

## 2017-05-05 ENCOUNTER — Inpatient Hospital Stay (HOSPITAL_COMMUNITY)
Admission: EM | Admit: 2017-05-05 | Discharge: 2017-05-12 | DRG: 100 | Disposition: A | Discharge status: Skilled Nursing Facility | Payer: No Typology Code available for payment source | Attending: Internal Medicine | Admitting: Internal Medicine

## 2017-05-05 ENCOUNTER — Inpatient Hospital Stay (HOSPITAL_COMMUNITY): Payer: No Typology Code available for payment source

## 2017-05-05 ENCOUNTER — Observation Stay (HOSPITAL_COMMUNITY): Payer: No Typology Code available for payment source

## 2017-05-05 ENCOUNTER — Emergency Department (HOSPITAL_COMMUNITY): Payer: No Typology Code available for payment source

## 2017-05-05 ENCOUNTER — Encounter (HOSPITAL_COMMUNITY): Payer: Self-pay

## 2017-05-05 DIAGNOSIS — F10239 Alcohol dependence with withdrawal, unspecified: Secondary | ICD-10-CM | POA: Diagnosis not present

## 2017-05-05 DIAGNOSIS — Z888 Allergy status to other drugs, medicaments and biological substances status: Secondary | ICD-10-CM | POA: Diagnosis not present

## 2017-05-05 DIAGNOSIS — K5900 Constipation, unspecified: Secondary | ICD-10-CM | POA: Diagnosis not present

## 2017-05-05 DIAGNOSIS — R627 Adult failure to thrive: Secondary | ICD-10-CM | POA: Diagnosis not present

## 2017-05-05 DIAGNOSIS — I10 Essential (primary) hypertension: Secondary | ICD-10-CM | POA: Diagnosis present

## 2017-05-05 DIAGNOSIS — R739 Hyperglycemia, unspecified: Secondary | ICD-10-CM | POA: Diagnosis not present

## 2017-05-05 DIAGNOSIS — E876 Hypokalemia: Secondary | ICD-10-CM | POA: Diagnosis not present

## 2017-05-05 DIAGNOSIS — F10231 Alcohol dependence with withdrawal delirium: Secondary | ICD-10-CM | POA: Diagnosis present

## 2017-05-05 DIAGNOSIS — I959 Hypotension, unspecified: Secondary | ICD-10-CM | POA: Diagnosis not present

## 2017-05-05 DIAGNOSIS — Z4682 Encounter for fitting and adjustment of non-vascular catheter: Secondary | ICD-10-CM | POA: Diagnosis not present

## 2017-05-05 DIAGNOSIS — D649 Anemia, unspecified: Secondary | ICD-10-CM | POA: Diagnosis not present

## 2017-05-05 DIAGNOSIS — J9601 Acute respiratory failure with hypoxia: Secondary | ICD-10-CM | POA: Diagnosis not present

## 2017-05-05 DIAGNOSIS — I160 Hypertensive urgency: Secondary | ICD-10-CM | POA: Diagnosis present

## 2017-05-05 DIAGNOSIS — G40901 Epilepsy, unspecified, not intractable, with status epilepticus: Secondary | ICD-10-CM | POA: Diagnosis not present

## 2017-05-05 DIAGNOSIS — Y92410 Unspecified street and highway as the place of occurrence of the external cause: Secondary | ICD-10-CM

## 2017-05-05 DIAGNOSIS — E872 Acidosis: Secondary | ICD-10-CM | POA: Diagnosis present

## 2017-05-05 DIAGNOSIS — G40501 Epileptic seizures related to external causes, not intractable, with status epilepticus: Secondary | ICD-10-CM | POA: Diagnosis present

## 2017-05-05 DIAGNOSIS — I739 Peripheral vascular disease, unspecified: Secondary | ICD-10-CM | POA: Diagnosis not present

## 2017-05-05 DIAGNOSIS — I517 Cardiomegaly: Secondary | ICD-10-CM | POA: Diagnosis not present

## 2017-05-05 DIAGNOSIS — D6959 Other secondary thrombocytopenia: Secondary | ICD-10-CM | POA: Diagnosis present

## 2017-05-05 DIAGNOSIS — R9082 White matter disease, unspecified: Secondary | ICD-10-CM | POA: Diagnosis not present

## 2017-05-05 DIAGNOSIS — N179 Acute kidney failure, unspecified: Secondary | ICD-10-CM | POA: Diagnosis present

## 2017-05-05 DIAGNOSIS — M5136 Other intervertebral disc degeneration, lumbar region: Secondary | ICD-10-CM | POA: Diagnosis not present

## 2017-05-05 DIAGNOSIS — Z881 Allergy status to other antibiotic agents status: Secondary | ICD-10-CM | POA: Diagnosis not present

## 2017-05-05 DIAGNOSIS — R0902 Hypoxemia: Secondary | ICD-10-CM | POA: Diagnosis not present

## 2017-05-05 DIAGNOSIS — H409 Unspecified glaucoma: Secondary | ICD-10-CM | POA: Diagnosis not present

## 2017-05-05 DIAGNOSIS — F10931 Alcohol use, unspecified with withdrawal delirium: Secondary | ICD-10-CM

## 2017-05-05 DIAGNOSIS — R748 Abnormal levels of other serum enzymes: Secondary | ICD-10-CM | POA: Diagnosis not present

## 2017-05-05 DIAGNOSIS — F1099 Alcohol use, unspecified with unspecified alcohol-induced disorder: Secondary | ICD-10-CM | POA: Diagnosis not present

## 2017-05-05 DIAGNOSIS — M6281 Muscle weakness (generalized): Secondary | ICD-10-CM | POA: Diagnosis not present

## 2017-05-05 DIAGNOSIS — E7800 Pure hypercholesterolemia, unspecified: Secondary | ICD-10-CM | POA: Diagnosis present

## 2017-05-05 DIAGNOSIS — I361 Nonrheumatic tricuspid (valve) insufficiency: Secondary | ICD-10-CM | POA: Diagnosis not present

## 2017-05-05 DIAGNOSIS — G9340 Encephalopathy, unspecified: Secondary | ICD-10-CM | POA: Diagnosis not present

## 2017-05-05 DIAGNOSIS — Z6822 Body mass index (BMI) 22.0-22.9, adult: Secondary | ICD-10-CM

## 2017-05-05 DIAGNOSIS — R7989 Other specified abnormal findings of blood chemistry: Secondary | ICD-10-CM | POA: Diagnosis not present

## 2017-05-05 DIAGNOSIS — K219 Gastro-esophageal reflux disease without esophagitis: Secondary | ICD-10-CM | POA: Diagnosis not present

## 2017-05-05 DIAGNOSIS — D696 Thrombocytopenia, unspecified: Secondary | ICD-10-CM | POA: Diagnosis not present

## 2017-05-05 DIAGNOSIS — Z9289 Personal history of other medical treatment: Secondary | ICD-10-CM

## 2017-05-05 DIAGNOSIS — R569 Unspecified convulsions: Secondary | ICD-10-CM

## 2017-05-05 DIAGNOSIS — K2921 Alcoholic gastritis with bleeding: Secondary | ICD-10-CM | POA: Diagnosis present

## 2017-05-05 DIAGNOSIS — J9811 Atelectasis: Secondary | ICD-10-CM | POA: Diagnosis not present

## 2017-05-05 DIAGNOSIS — E785 Hyperlipidemia, unspecified: Secondary | ICD-10-CM | POA: Diagnosis present

## 2017-05-05 DIAGNOSIS — R0602 Shortness of breath: Secondary | ICD-10-CM | POA: Diagnosis not present

## 2017-05-05 DIAGNOSIS — R0689 Other abnormalities of breathing: Secondary | ICD-10-CM | POA: Diagnosis not present

## 2017-05-05 DIAGNOSIS — S0990XA Unspecified injury of head, initial encounter: Secondary | ICD-10-CM | POA: Diagnosis not present

## 2017-05-05 DIAGNOSIS — J9690 Respiratory failure, unspecified, unspecified whether with hypoxia or hypercapnia: Secondary | ICD-10-CM | POA: Diagnosis not present

## 2017-05-05 DIAGNOSIS — R1110 Vomiting, unspecified: Secondary | ICD-10-CM | POA: Diagnosis not present

## 2017-05-05 DIAGNOSIS — I340 Nonrheumatic mitral (valve) insufficiency: Secondary | ICD-10-CM | POA: Diagnosis not present

## 2017-05-05 DIAGNOSIS — K922 Gastrointestinal hemorrhage, unspecified: Secondary | ICD-10-CM | POA: Diagnosis not present

## 2017-05-05 DIAGNOSIS — R945 Abnormal results of liver function studies: Secondary | ICD-10-CM

## 2017-05-05 DIAGNOSIS — S199XXA Unspecified injury of neck, initial encounter: Secondary | ICD-10-CM | POA: Diagnosis not present

## 2017-05-05 LAB — CBC WITH DIFFERENTIAL/PLATELET
Basophils Absolute: 0.1 10*3/uL (ref 0.0–0.1)
Basophils Relative: 1 %
Eosinophils Absolute: 0 10*3/uL (ref 0.0–0.7)
Eosinophils Relative: 0 %
HCT: 37.7 % — ABNORMAL LOW (ref 39.0–52.0)
Hemoglobin: 13 g/dL (ref 13.0–17.0)
Lymphocytes Relative: 14 %
Lymphs Abs: 1.9 10*3/uL (ref 0.7–4.0)
MCH: 33.5 pg (ref 26.0–34.0)
MCHC: 34.5 g/dL (ref 30.0–36.0)
MCV: 97.2 fL (ref 78.0–100.0)
Monocytes Absolute: 1.1 10*3/uL — ABNORMAL HIGH (ref 0.1–1.0)
Monocytes Relative: 8 %
Neutro Abs: 10.3 10*3/uL — ABNORMAL HIGH (ref 1.7–7.7)
Neutrophils Relative %: 77 %
Platelets: 169 10*3/uL (ref 150–400)
RBC: 3.88 MIL/uL — ABNORMAL LOW (ref 4.22–5.81)
RDW: 13.6 % (ref 11.5–15.5)
WBC: 13.4 10*3/uL — ABNORMAL HIGH (ref 4.0–10.5)

## 2017-05-05 LAB — COMPREHENSIVE METABOLIC PANEL
ALBUMIN: 4.1 g/dL (ref 3.5–5.0)
ALK PHOS: 40 U/L (ref 38–126)
ALT: 29 U/L (ref 17–63)
AST: 55 U/L — AB (ref 15–41)
Anion gap: 13 (ref 5–15)
BILIRUBIN TOTAL: 2.1 mg/dL — AB (ref 0.3–1.2)
BUN: 7 mg/dL (ref 6–20)
CALCIUM: 9.2 mg/dL (ref 8.9–10.3)
CO2: 21 mmol/L — ABNORMAL LOW (ref 22–32)
CREATININE: 1.03 mg/dL (ref 0.61–1.24)
Chloride: 105 mmol/L (ref 101–111)
GFR calc Af Amer: >60 mL/min (ref >60)
GLUCOSE: 206 mg/dL — AB (ref 65–99)
Potassium: 4.1 mmol/L (ref 3.5–5.1)
Sodium: 139 mmol/L (ref 135–145)
TOTAL PROTEIN: 8 g/dL (ref 6.5–8.1)

## 2017-05-05 LAB — URINALYSIS, ROUTINE W REFLEX MICROSCOPIC
Bacteria, UA: NONE SEEN
Bilirubin Urine: NEGATIVE
Glucose, UA: 150 mg/dL — AB
Hgb urine dipstick: NEGATIVE
Ketones, ur: NEGATIVE mg/dL
Leukocytes, UA: NEGATIVE
Nitrite: NEGATIVE
Protein, ur: 30 mg/dL — AB
Specific Gravity, Urine: 1.009 (ref 1.005–1.030)
Squamous Epithelial / LPF: NONE SEEN
pH: 7 (ref 5.0–8.0)

## 2017-05-05 LAB — CBG MONITORING, ED: Glucose-Capillary: 191 mg/dL — ABNORMAL HIGH (ref 65–99)

## 2017-05-05 LAB — ACETAMINOPHEN LEVEL

## 2017-05-05 LAB — HEMOGLOBIN A1C
Hgb A1c MFr Bld: 4.9 % (ref 4.8–5.6)
Mean Plasma Glucose: 93.93 mg/dL

## 2017-05-05 LAB — I-STAT TROPONIN, ED: TROPONIN I, POC: 0.03 ng/mL (ref 0.00–0.08)

## 2017-05-05 LAB — I-STAT CG4 LACTIC ACID, ED
LACTIC ACID, VENOUS: 4.91 mmol/L — AB (ref 0.5–1.9)
Lactic Acid, Venous: 2.48 mmol/L (ref 0.5–1.9)

## 2017-05-05 LAB — AMMONIA: AMMONIA: 31 umol/L (ref 9–35)

## 2017-05-05 LAB — MAGNESIUM: MAGNESIUM: 1.9 mg/dL (ref 1.7–2.4)

## 2017-05-05 LAB — PHOSPHORUS: Phosphorus: 2.2 mg/dL — ABNORMAL LOW (ref 2.5–4.6)

## 2017-05-05 LAB — ETHANOL: Alcohol, Ethyl (B): <5 mg/dL (ref <5)

## 2017-05-05 LAB — SALICYLATE LEVEL

## 2017-05-05 MED ORDER — SODIUM CHLORIDE 0.45 % IV SOLN
INTRAVENOUS | Status: DC
  Administered 2017-05-05: 22:00:00 via INTRAVENOUS

## 2017-05-05 MED ORDER — THIAMINE HCL 100 MG/ML IJ SOLN
100.0000 mg | Freq: Every day | INTRAMUSCULAR | Status: DC
  Administered 2017-05-06: 100 mg via INTRAVENOUS
  Filled 2017-05-05: qty 1

## 2017-05-05 MED ORDER — MIDAZOLAM HCL 50 MG/10ML IJ SOLN
7.0000 mg/h | INTRAMUSCULAR | Status: DC
  Administered 2017-05-06 (×2): 7 mg/h via INTRAVENOUS
  Filled 2017-05-05 (×3): qty 10

## 2017-05-05 MED ORDER — LORAZEPAM 2 MG/ML IJ SOLN
1.0000 mg | Freq: Once | INTRAMUSCULAR | Status: AC
  Administered 2017-05-05: 1 mg via INTRAVENOUS

## 2017-05-05 MED ORDER — SODIUM CHLORIDE 0.9 % IV SOLN
8.0000 mg/h | INTRAVENOUS | Status: DC
  Administered 2017-05-05 – 2017-05-06 (×2): 8 mg/h via INTRAVENOUS
  Filled 2017-05-05 (×4): qty 80

## 2017-05-05 MED ORDER — ROCURONIUM BROMIDE 50 MG/5ML IV SOLN
80.0000 mg | Freq: Once | INTRAVENOUS | Status: AC
  Administered 2017-05-05: 80 mg via INTRAVENOUS

## 2017-05-05 MED ORDER — FENTANYL CITRATE (PF) 100 MCG/2ML IJ SOLN
50.0000 ug | INTRAMUSCULAR | Status: DC | PRN
  Administered 2017-05-06 – 2017-05-07 (×6): 50 ug via INTRAVENOUS
  Filled 2017-05-05 (×7): qty 2

## 2017-05-05 MED ORDER — LEVOFLOXACIN IN D5W 750 MG/150ML IV SOLN
750.0000 mg | Freq: Once | INTRAVENOUS | Status: AC
  Administered 2017-05-05: 750 mg via INTRAVENOUS
  Filled 2017-05-05: qty 150

## 2017-05-05 MED ORDER — FENTANYL CITRATE (PF) 100 MCG/2ML IJ SOLN
50.0000 ug | INTRAMUSCULAR | Status: AC | PRN
  Administered 2017-05-06 (×3): 50 ug via INTRAVENOUS
  Filled 2017-05-05 (×3): qty 2

## 2017-05-05 MED ORDER — LORAZEPAM 2 MG/ML IJ SOLN: 2.0000 mg | Freq: Once | INTRAMUSCULAR | Status: DC

## 2017-05-05 MED ORDER — VANCOMYCIN HCL 10 G IV SOLR
1500.0000 mg | Freq: Once | INTRAVENOUS | Status: AC
  Administered 2017-05-05: 1500 mg via INTRAVENOUS
  Filled 2017-05-05: qty 1500

## 2017-05-05 MED ORDER — LORAZEPAM 2 MG/ML IJ SOLN
1.0000 mg | Freq: Once | INTRAMUSCULAR | Status: AC
  Administered 2017-05-05: 1 mg via INTRAVENOUS
  Filled 2017-05-05: qty 1

## 2017-05-05 MED ORDER — SODIUM CHLORIDE 0.9 % IV BOLUS (SEPSIS)
1000.0000 mL | Freq: Once | INTRAVENOUS | Status: AC
  Administered 2017-05-05: 1000 mL via INTRAVENOUS

## 2017-05-05 MED ORDER — ETOMIDATE 2 MG/ML IV SOLN
20.0000 mg | Freq: Once | INTRAVENOUS | Status: AC
  Administered 2017-05-05: 20 mg via INTRAVENOUS

## 2017-05-05 MED ORDER — PROPOFOL 1000 MG/100ML IV EMUL
5.0000 ug/kg/min | Freq: Once | INTRAVENOUS | Status: AC
  Administered 2017-05-05: 15 ug/kg/min via INTRAVENOUS
  Filled 2017-05-05: qty 100

## 2017-05-05 MED ORDER — ONDANSETRON HCL 4 MG/2ML IJ SOLN
INTRAMUSCULAR | Status: AC
  Administered 2017-05-05: 4 mg
  Filled 2017-05-05: qty 2

## 2017-05-05 MED ORDER — LORAZEPAM 2 MG/ML IJ SOLN
2.0000 mg | Freq: Once | INTRAMUSCULAR | Status: DC
  Filled 2017-05-05: qty 1

## 2017-05-05 MED ORDER — SODIUM CHLORIDE 0.9 % IV SOLN: 500.0000 mg | Freq: Two times a day (BID) | INTRAVENOUS | Status: DC

## 2017-05-05 MED ORDER — SODIUM CHLORIDE 0.9 % IV SOLN
INTRAVENOUS | Status: DC
  Administered 2017-05-06: 19:00:00 via INTRAVENOUS
  Administered 2017-05-06: 75 mL/h via INTRAVENOUS
  Administered 2017-05-06: 01:00:00 via INTRAVENOUS
  Administered 2017-05-07: 75 mL/h via INTRAVENOUS
  Administered 2017-05-07: 10:00:00 via INTRAVENOUS

## 2017-05-05 MED ORDER — SODIUM CHLORIDE 0.9 % IV SOLN
1000.0000 mg | Freq: Once | INTRAVENOUS | Status: AC
  Administered 2017-05-05: 1000 mg via INTRAVENOUS
  Filled 2017-05-05: qty 10

## 2017-05-05 MED ORDER — MIDAZOLAM BOLUS VIA INFUSION
7.0000 mg | Freq: Once | INTRAVENOUS | Status: AC
  Administered 2017-05-06: 7 mg via INTRAVENOUS
  Filled 2017-05-05: qty 7

## 2017-05-05 MED ORDER — SODIUM CHLORIDE 0.9 % IV SOLN
80.0000 mg | Freq: Once | INTRAVENOUS | Status: AC
  Administered 2017-05-06: 80 mg via INTRAVENOUS
  Filled 2017-05-05 (×2): qty 80

## 2017-05-05 MED ORDER — SODIUM CHLORIDE 0.9 % IV SOLN: 250.0000 mL | INTRAVENOUS | Status: DC | PRN

## 2017-05-05 MED ORDER — LORAZEPAM 2 MG/ML IJ SOLN
2.0000 mg | Freq: Once | INTRAMUSCULAR | Status: AC
  Administered 2017-05-05: 2 mg via INTRAVENOUS
  Filled 2017-05-05: qty 1

## 2017-05-05 MED ORDER — AZTREONAM 1 G IJ SOLR
1.0000 g | Freq: Once | INTRAMUSCULAR | Status: DC
  Filled 2017-05-05: qty 1

## 2017-05-05 MED ORDER — LORAZEPAM 2 MG/ML IJ SOLN
1.0000 mg | INTRAMUSCULAR | Status: DC | PRN
  Filled 2017-05-05 (×2): qty 1

## 2017-05-05 MED ORDER — FOLIC ACID 5 MG/ML IJ SOLN
1.0000 mg | Freq: Every day | INTRAMUSCULAR | Status: DC
  Administered 2017-05-06: 1 mg via INTRAVENOUS
  Filled 2017-05-05 (×2): qty 0.2

## 2017-05-05 MED ORDER — PROPOFOL 1000 MG/100ML IV EMUL
0.0000 ug/kg/min | INTRAVENOUS | Status: DC
  Administered 2017-05-05: 35 ug/kg/min via INTRAVENOUS
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
## 2017-05-05 NOTE — ED Notes (Signed)
Patient transported to CT 

## 2017-05-05 NOTE — Progress Notes (Signed)
In chart for SBAR and lab review for admission to 3M05; patient now going to a higher level of care than ICU.

## 2017-05-05 NOTE — ED Notes (Signed)
Pt tachycardic and hypoxic on monitor. This rn went to pts room to assess condition and found pt postictal with bloody secretions coming from mouth and covered in urine. PT not responsive to voice or painful stimuli at that time. EDP made aware and orders received for 1 mg ativan iv.

## 2017-05-05 NOTE — ED Notes (Signed)
Pt started to have red irritated area about if site on right forearm where vanc and levaquin were infusing. Both medication switched to iv in [place]. Entire left arm became red so both antibiotics stopped and PA aware.

## 2017-05-05 NOTE — ED Provider Notes (Signed)
Patient required intubation for airway protection due to altered mental status and frequent vomiting.     Procedure Name: Intubation Date/Time: 05/05/2017 9:58 PM Performed by: Lorayne Bender Pre-anesthesia Checklist: Patient identified, Emergency Drugs available, Suction available, Patient being monitored and Timeout performed Oxygen Delivery Method: Ambu bag Preoxygenation: Pre-oxygenation with 100% oxygen Induction Type: Rapid sequence Ventilation: Mask ventilation without difficulty Laryngoscope Size: Mac and 4 Grade View: Grade II Tube size: 7.5 mm Number of attempts: 1 Airway Equipment and Method: Stylet Placement Confirmation: ETT inserted through vocal cords under direct vision,  CO2 detector and Breath sounds checked- equal and bilateral Secured at: 23 cm Tube secured with: ETT holder Comments: Patient 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 These were removed prior to this procedure.    OG placement Date/Time: 05/05/2017 10:05 PM Performed by: Lorayne Bender Authorized by: Arlean Hopping C  Consent: The procedure was performed in an emergent situation. Patient identity confirmed: arm band and provided demographic data Local anesthesia used: no  Anesthesia: Local anesthesia used: no Patient tolerance: Patient tolerated the procedure well with no immediate complications Comments: 60AV placed following intubation. Positive epigastric air able to be auscultated. Placed on intermittent suction with dark brown fluid returned.      Lorayne Bender, PA-C 05/05/17 2216    Virgel Manifold, MD 05/12/17 [phone]
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
## 2017-05-05 NOTE — ED Notes (Signed)
Pt had to be intubated d/t airway protection, pt had another episode of coffee ground emesis

## 2017-05-05 NOTE — ED Notes (Signed)
Pt highly agitated trying to climb out of bed, attempted to reorient. Posey placed under pt, another 1mg  ativan given. Pt brief changed again.

## 2017-05-05 NOTE — ED Notes (Signed)
CBG 191 
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
## 2017-05-05 NOTE — ED Notes (Signed)
EDP made aware that pt last ETOH intake 2 days ago with hx of alcohol withdrawal

## 2017-05-05 NOTE — ED Notes (Signed)
X-ray called to check on pt status and if he can go to X-ray.  Informed X-ray tech that patient is intubated.  X-ray will call EDP to clarify or change order.

## 2017-05-05 NOTE — ED Notes (Signed)
PT still agitated at this time. Had 1 episode of brown liquid emesis on himself. Pt changed and dr. Juleen China aware. VO for additional 2mg  of ativan received.
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
## 2017-05-06 ENCOUNTER — Inpatient Hospital Stay (HOSPITAL_COMMUNITY): Payer: No Typology Code available for payment source

## 2017-05-06 DIAGNOSIS — G9340 Encephalopathy, unspecified: Secondary | ICD-10-CM

## 2017-05-06 DIAGNOSIS — R569 Unspecified convulsions: Secondary | ICD-10-CM

## 2017-05-06 DIAGNOSIS — I10 Essential (primary) hypertension: Secondary | ICD-10-CM

## 2017-05-06 DIAGNOSIS — J9601 Acute respiratory failure with hypoxia: Secondary | ICD-10-CM

## 2017-05-06 LAB — BLOOD GAS, ARTERIAL
Acid-base deficit: 2.3 mmol/L — ABNORMAL HIGH (ref 0.0–2.0)
Bicarbonate: 21.3 mmol/L (ref 20.0–28.0)
DRAWN BY: 24487
FIO2: 50
MECHVT: 550 mL
O2 Saturation: 98.6 %
PATIENT TEMPERATURE: 98.6
PCO2 ART: 32.4 mmHg (ref 32.0–48.0)
PEEP/CPAP: 5 cmH2O
PO2 ART: 130 mmHg — AB (ref 83.0–108.0)
RATE: 15 resp/min
pH, Arterial: 7.433 (ref 7.350–7.450)

## 2017-05-06 LAB — PROCALCITONIN: PROCALCITONIN: 0.2 ng/mL

## 2017-05-06 LAB — GLUCOSE, CAPILLARY
Glucose-Capillary: 143 mg/dL — ABNORMAL HIGH (ref 65–99)
Glucose-Capillary: 112 mg/dL — ABNORMAL HIGH (ref 65–99)
Glucose-Capillary: 86 mg/dL (ref 65–99)
Glucose-Capillary: 99 mg/dL (ref 65–99)

## 2017-05-06 LAB — RAPID URINE DRUG SCREEN, HOSP PERFORMED
AMPHETAMINES: NOT DETECTED
BENZODIAZEPINES: NOT DETECTED
Barbiturates: NOT DETECTED
Cocaine: NOT DETECTED
OPIATES: NOT DETECTED
TETRAHYDROCANNABINOL: NOT DETECTED

## 2017-05-06 LAB — BASIC METABOLIC PANEL
Anion gap: 13 (ref 5–15)
BUN: 11 mg/dL (ref 6–20)
CALCIUM: 7.9 mg/dL — AB (ref 8.9–10.3)
CHLORIDE: 106 mmol/L (ref 101–111)
CO2: 19 mmol/L — AB (ref 22–32)
CREATININE: 1.1 mg/dL (ref 0.61–1.24)
GFR calc Af Amer: >60 mL/min (ref >60)
GFR calc non Af Amer: >60 mL/min (ref >60)
GLUCOSE: 152 mg/dL — AB (ref 65–99)
Potassium: 3.3 mmol/L — ABNORMAL LOW (ref 3.5–5.1)
Sodium: 138 mmol/L (ref 135–145)

## 2017-05-06 LAB — CBC
HEMATOCRIT: 33.2 % — AB (ref 39.0–52.0)
Hemoglobin: 11 g/dL — ABNORMAL LOW (ref 13.0–17.0)
MCH: 32.6 pg (ref 26.0–34.0)
MCHC: 33.1 g/dL (ref 30.0–36.0)
MCV: 98.5 fL (ref 78.0–100.0)
Platelets: 127 10*3/uL — ABNORMAL LOW (ref 150–400)
RBC: 3.37 MIL/uL — AB (ref 4.22–5.81)
RDW: 13.9 % (ref 11.5–15.5)
WBC: 11.8 10*3/uL — AB (ref 4.0–10.5)

## 2017-05-06 LAB — TROPONIN I
TROPONIN I: 1.38 ng/mL — AB (ref <0.03)
Troponin I: 1.65 ng/mL (ref <0.03)

## 2017-05-06 LAB — TRIGLYCERIDES: TRIGLYCERIDES: 84 mg/dL (ref <150)

## 2017-05-06 LAB — MRSA PCR SCREENING: MRSA BY PCR: NEGATIVE

## 2017-05-06 LAB — MAGNESIUM: MAGNESIUM: 1.7 mg/dL (ref 1.7–2.4)

## 2017-05-06 LAB — LACTIC ACID, PLASMA: LACTIC ACID, VENOUS: 1.8 mmol/L (ref 0.5–1.9)

## 2017-05-06 LAB — PHOSPHORUS: Phosphorus: 4.5 mg/dL (ref 2.5–4.6)

## 2017-05-06 MED ORDER — FOLIC ACID 1 MG PO TABS
1.0000 mg | ORAL_TABLET | Freq: Every day | ORAL | Status: DC
  Administered 2017-05-07 – 2017-05-09 (×3): 1 mg
  Filled 2017-05-06 (×4): qty 1

## 2017-05-06 MED ORDER — LORAZEPAM 2 MG/ML IJ SOLN
0.0000 mg | Freq: Four times a day (QID) | INTRAMUSCULAR | Status: AC
  Administered 2017-05-07: 3 mg via INTRAVENOUS
  Administered 2017-05-07: 2 mg via INTRAVENOUS
  Filled 2017-05-06: qty 1
  Filled 2017-05-06: qty 2

## 2017-05-06 MED ORDER — VITAMIN B-1 100 MG PO TABS
100.0000 mg | ORAL_TABLET | Freq: Every day | ORAL | Status: DC
  Filled 2017-05-06: qty 1

## 2017-05-06 MED ORDER — PANTOPRAZOLE SODIUM 40 MG IV SOLR
40.0000 mg | Freq: Two times a day (BID) | INTRAVENOUS | Status: DC
  Administered 2017-05-06 – 2017-05-07 (×2): 40 mg via INTRAVENOUS
  Filled 2017-05-06 (×2): qty 40

## 2017-05-06 MED ORDER — ADULT MULTIVITAMIN W/MINERALS CH
1.0000 | ORAL_TABLET | Freq: Every day | ORAL | Status: DC
  Administered 2017-05-06 – 2017-05-10 (×5): 1
  Filled 2017-05-06 (×5): qty 1

## 2017-05-06 MED ORDER — LORAZEPAM 2 MG/ML IJ SOLN: 1.0000 mg | Freq: Four times a day (QID) | INTRAMUSCULAR | Status: AC | PRN

## 2017-05-06 MED ORDER — ORAL CARE MOUTH RINSE
15.0000 mL | Freq: Four times a day (QID) | OROMUCOSAL | Status: DC
  Administered 2017-05-06 – 2017-05-09 (×12): 15 mL via OROMUCOSAL

## 2017-05-06 MED ORDER — MIDAZOLAM BOLUS VIA INFUSION
1.0000 mg | INTRAVENOUS | Status: DC | PRN
  Administered 2017-05-07: 2 mg via INTRAVENOUS
  Filled 2017-05-06: qty 2

## 2017-05-06 MED ORDER — ADULT MULTIVITAMIN W/MINERALS CH
1.0000 | ORAL_TABLET | Freq: Every day | ORAL | Status: DC
  Filled 2017-05-06: qty 1

## 2017-05-06 MED ORDER — POTASSIUM CHLORIDE 20 MEQ/15ML (10%) PO SOLN
40.0000 meq | Freq: Three times a day (TID) | ORAL | Status: AC
  Administered 2017-05-06 (×2): 40 meq
  Filled 2017-05-06: qty 30

## 2017-05-06 MED ORDER — MAGNESIUM SULFATE 2 GM/50ML IV SOLN
2.0000 g | Freq: Once | INTRAVENOUS | Status: AC
  Administered 2017-05-06: 2 g via INTRAVENOUS
  Filled 2017-05-06: qty 50

## 2017-05-06 MED ORDER — THIAMINE HCL 100 MG/ML IJ SOLN
100.0000 mg | Freq: Every day | INTRAMUSCULAR | Status: DC
  Filled 2017-05-06 (×3): qty 1

## 2017-05-06 MED ORDER — LORAZEPAM 2 MG/ML IJ SOLN
0.0000 mg | Freq: Two times a day (BID) | INTRAMUSCULAR | Status: AC
  Administered 2017-05-09: 1 mg via INTRAVENOUS
  Administered 2017-05-10: 2 mg via INTRAVENOUS
  Filled 2017-05-06 (×2): qty 1

## 2017-05-06 MED ORDER — SODIUM CHLORIDE 0.9 % IV SOLN
0.0000 mg/h | INTRAVENOUS | Status: DC
  Administered 2017-05-06: 8 mg/h via INTRAVENOUS
  Administered 2017-05-06: 7 mg/h via INTRAVENOUS
  Administered 2017-05-07 (×2): 8 mg/h via INTRAVENOUS
  Filled 2017-05-06 (×4): qty 10

## 2017-05-06 MED ORDER — THIAMINE HCL 100 MG/ML IJ SOLN: 100.0000 mg | Freq: Every day | INTRAMUSCULAR | Status: DC

## 2017-05-06 MED ORDER — POTASSIUM CHLORIDE 20 MEQ/15ML (10%) PO SOLN
40.0000 meq | Freq: Three times a day (TID) | ORAL | Status: DC
  Filled 2017-05-06: qty 30

## 2017-05-06 MED ORDER — VITAMIN B-1 100 MG PO TABS
100.0000 mg | ORAL_TABLET | Freq: Every day | ORAL | Status: DC
  Administered 2017-05-07 – 2017-05-09 (×3): 100 mg
  Filled 2017-05-06 (×4): qty 1

## 2017-05-06 MED ORDER — LORAZEPAM 1 MG PO TABS: 1.0000 mg | ORAL_TABLET | Freq: Four times a day (QID) | ORAL | Status: AC | PRN

## 2017-05-06 MED ORDER — FOLIC ACID 1 MG PO TABS
1.0000 mg | ORAL_TABLET | Freq: Every day | ORAL | Status: DC
  Filled 2017-05-06: qty 1

## 2017-05-06 MED ORDER — CHLORHEXIDINE GLUCONATE 0.12% ORAL RINSE (MEDLINE KIT)
15.0000 mL | Freq: Two times a day (BID) | OROMUCOSAL | Status: DC
  Administered 2017-05-06 – 2017-05-09 (×6): 15 mL via OROMUCOSAL

## 2017-05-06 NOTE — Progress Notes (Addendum)
LTM EEG started in ED, pt being moved to 2M07. Dr Otelia Limes is aware.
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
## 2017-05-06 NOTE — Progress Notes (Signed)
Pt has been moved to 2M07 LTM EEG is set up and recording.
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
## 2017-05-06 NOTE — ED Provider Notes (Signed)
  Clinical Course as of May 06 32  Tue May 05, 2017  1840 RN reports another seizure. Unknown amount of time. Patient not seizing upon my reexam, but unresponsive.   [SJ]  1922 Spoke with Dr. Otelia Limes, neurologist, who agrees to come see the patient. Also agrees with loading with 1000mg  of Keppra. Also requests 500mg  BID of Keppra. States pt will need to be admitted via hospitalist.   [SJ]  1954 Spoke with Dr. Robb Matar, hospitalist, who agreed to admit the patient.  [SJ]  2228 Spoke with patient's daugther, Shanda Bumps. States patient was put on a benzo a few months ago, but stopped taking it abruptly 2-3 days ago. Patient typically drinks 7-8 vodka drinks a day. Adds that patient also stopped taking his HTN medications about three weeks ago because his BP was dropping to about 100 systolic. Advises she lives 4 hours away and requests a call for any changes [phone]).  [SJ]    Clinical Course User Index [SJ] De Blanch 05/06/17 0033    Raeford Razor, MD 05/12/17 [phone]

## 2017-05-07 ENCOUNTER — Inpatient Hospital Stay (HOSPITAL_COMMUNITY): Payer: No Typology Code available for payment source

## 2017-05-07 DIAGNOSIS — J9601 Acute respiratory failure with hypoxia: Secondary | ICD-10-CM

## 2017-05-07 DIAGNOSIS — F10931 Alcohol use, unspecified with withdrawal delirium: Secondary | ICD-10-CM

## 2017-05-07 DIAGNOSIS — F10231 Alcohol dependence with withdrawal delirium: Secondary | ICD-10-CM

## 2017-05-07 LAB — CBC
HCT: 30.7 % — ABNORMAL LOW (ref 39.0–52.0)
HEMOGLOBIN: 10.2 g/dL — AB (ref 13.0–17.0)
MCH: 33.3 pg (ref 26.0–34.0)
MCHC: 33.2 g/dL (ref 30.0–36.0)
MCV: 100.3 fL — AB (ref 78.0–100.0)
PLATELETS: 104 10*3/uL — AB (ref 150–400)
RBC: 3.06 MIL/uL — AB (ref 4.22–5.81)
RDW: 14.1 % (ref 11.5–15.5)
WBC: 9.4 10*3/uL (ref 4.0–10.5)

## 2017-05-07 LAB — PHOSPHORUS: Phosphorus: 2.6 mg/dL (ref 2.5–4.6)

## 2017-05-07 LAB — BLOOD GAS, ARTERIAL
ACID-BASE DEFICIT: 3.6 mmol/L — AB (ref 0.0–2.0)
BICARBONATE: 19.9 mmol/L — AB (ref 20.0–28.0)
Drawn by: 252031
FIO2: 40
LHR: 15 {breaths}/min
O2 Saturation: 98.9 %
PEEP: 5 cmH2O
Patient temperature: 98.6
VT: 550 mL
pCO2 arterial: 30 mmHg — ABNORMAL LOW (ref 32.0–48.0)
pH, Arterial: 7.436 (ref 7.350–7.450)
pO2, Arterial: 137 mmHg — ABNORMAL HIGH (ref 83.0–108.0)

## 2017-05-07 LAB — MAGNESIUM: MAGNESIUM: 2.3 mg/dL (ref 1.7–2.4)

## 2017-05-07 LAB — BASIC METABOLIC PANEL
ANION GAP: 7 (ref 5–15)
BUN: 14 mg/dL (ref 6–20)
CHLORIDE: 112 mmol/L — AB (ref 101–111)
CO2: 22 mmol/L (ref 22–32)
Calcium: 8.4 mg/dL — ABNORMAL LOW (ref 8.9–10.3)
Creatinine, Ser: 1.25 mg/dL — ABNORMAL HIGH (ref 0.61–1.24)
GFR calc Af Amer: >60 mL/min (ref >60)
GFR, EST NON AFRICAN AMERICAN: 55 mL/min — AB (ref >60)
Glucose, Bld: 87 mg/dL (ref 65–99)
POTASSIUM: 3 mmol/L — AB (ref 3.5–5.1)
SODIUM: 141 mmol/L (ref 135–145)

## 2017-05-07 LAB — GLUCOSE, CAPILLARY
GLUCOSE-CAPILLARY: 75 mg/dL (ref 65–99)
GLUCOSE-CAPILLARY: 83 mg/dL (ref 65–99)
GLUCOSE-CAPILLARY: 83 mg/dL (ref 65–99)
GLUCOSE-CAPILLARY: 87 mg/dL (ref 65–99)
GLUCOSE-CAPILLARY: 90 mg/dL (ref 65–99)
Glucose-Capillary: 90 mg/dL (ref 65–99)

## 2017-05-07 MED ORDER — SODIUM CHLORIDE 0.9 % IV SOLN
0.0000 ug/kg/h | INTRAVENOUS | Status: DC
  Administered 2017-05-07: 0.6 ug/kg/h via INTRAVENOUS
  Administered 2017-05-07: 0.7 ug/kg/h via INTRAVENOUS
  Administered 2017-05-07: 0.4 ug/kg/h via INTRAVENOUS
  Filled 2017-05-07 (×4): qty 2

## 2017-05-07 MED ORDER — POTASSIUM CHLORIDE 20 MEQ/15ML (10%) PO SOLN
40.0000 meq | Freq: Once | ORAL | Status: AC
  Administered 2017-05-07: 40 meq
  Filled 2017-05-07: qty 30

## 2017-05-07 MED ORDER — DEXMEDETOMIDINE HCL IN NACL 400 MCG/100ML IV SOLN
0.0000 ug/kg/h | INTRAVENOUS | Status: DC
  Administered 2017-05-08 (×2): 1 ug/kg/h via INTRAVENOUS
  Administered 2017-05-08: 0.5 ug/kg/h via INTRAVENOUS
  Filled 2017-05-07 (×3): qty 100

## 2017-05-07 MED ORDER — PANTOPRAZOLE SODIUM 40 MG IV SOLR
40.0000 mg | INTRAVENOUS | Status: DC
  Filled 2017-05-07: qty 40
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
## 2017-05-07 NOTE — Progress Notes (Signed)
Patient transported on vent from 33M-07 to MRI and back without complication.

## 2017-05-07 NOTE — Progress Notes (Signed)
eLink Physician-Brief Progress Note Patient Name: Tommy Jensen DOB: 26-Jun-1941 MRN: [id number]   Date of Service  05/07/2017  HPI/Events of Note  K+ = 3.0 and Creatinine = 1.25.  eICU Interventions  Will replace K+.      Intervention Category Major Interventions: Electrolyte abnormality - evaluation and management  Roniyah Llorens Dennard Nip 05/07/2017, 6:33 AM

## 2017-05-07 NOTE — Progress Notes (Signed)
Neurology progress note  Subjective: Patient seen and examined this morning.  No acute overnight events  Objective: Vitals:   05/07/17 0800 05/07/17 0900  BP: 116/68 106/65  Pulse: 86 79  Resp: (!) 21 (!) 22  Temp:    SpO2: 97% 98%  Gen.: Intubated, on 8 of Versed. HEENT: Normocephalic, atraumatic, moist MEMBRANES CVS: S1-S2 heard, regular rate rhythm Respiratory: Chest clear to auscultation Extremities: No edema Neurological exam Mental status: Sleepy, opens eyes to voice, follows all simple commands Cranial nerves: Pupils equal round reactive to light, extraocular movements movements intact, visual fields full to threat, facial symmetry difficult to ascertain with acute. Motor exam: Moves all 4 extremities antigravity to command. Sensory: Grimaces and withdraws all 4 to noxious tinnitus. Coordination: Mild tremulousness with no ataxia on finger nose finger. 2+ DTRs. Gait was not tested.  Medications  Current Facility-Administered Medications:  .  0.9 %  sodium chloride infusion, , Intravenous, Continuous, Desai, Rahul P, PA-C, Last Rate: 75 mL/hr at 05/07/17 0935 .  0.9 %  sodium chloride infusion, 250 mL, Intravenous, PRN, Desai, Rahul P, PA-C .  chlorhexidine gluconate (MEDLINE KIT) (PERIDEX) 0.12 % solution 15 mL, 15 mL, Mouth Rinse, BID, Desai, Rahul P, PA-C, 15 mL at 05/07/17 0723 .  fentaNYL (SUBLIMAZE) injection 50 mcg, 50 mcg, Intravenous, Q2H PRN, Shearon Stalls, Rahul P, PA-C, 50 mcg at 05/07/17 0502 .  folic acid (FOLVITE) tablet 1 mg, 1 mg, Per Tube, Daily, Patterson Hammersmith, RPH .  LORazepam (ATIVAN) injection 0-4 mg, 0-4 mg, Intravenous, Q6H **FOLLOWED BY** [START ON 05/08/2017] LORazepam (ATIVAN) injection 0-4 mg, 0-4 mg, Intravenous, Q12H, Yacoub, Wesam G, MD .  LORazepam (ATIVAN) tablet 1 mg, 1 mg, Oral, Q6H PRN **OR** LORazepam (ATIVAN) injection 1 mg, 1 mg, Intravenous, Q6H PRN, Rush Farmer, MD .  LORazepam (ATIVAN) injection 1-2 mg, 1-2 mg, Intravenous, Q2H PRN,  Desai, Rahul P, PA-C .  MEDLINE mouth rinse, 15 mL, Mouth Rinse, QID, Desai, Rahul P, PA-C, 15 mL at 05/07/17 0346 .  midazolam (VERSED) 50 mg in sodium chloride 0.9 % 50 mL (1 mg/mL) infusion, 0-10 mg/hr, Intravenous, Continuous, Rush Farmer, MD, Last Rate: 4 mL/hr at 05/07/17 0815, 4 mg/hr at 05/07/17 0815 .  midazolam (VERSED) bolus via infusion 1-2 mg, 1-2 mg, Intravenous, Q2H PRN, Rush Farmer, MD, 2 mg at 05/07/17 0735 .  multivitamin with minerals tablet 1 tablet, 1 tablet, Per Tube, Daily, Patterson Hammersmith, RPH, 1 tablet at 05/06/17 1155 .  pantoprazole (PROTONIX) injection 40 mg, 40 mg, Intravenous, Q12H, Rush Farmer, MD, 40 mg at 05/06/17 2102 .  thiamine (VITAMIN B-1) tablet 100 mg, 100 mg, Per Tube, Daily **OR** thiamine (B-1) injection 100 mg, 100 mg, Intravenous, Daily, Patterson Hammersmith, [facility]  CBC    Component Value Date/Time   WBC 9.4 05/07/2017 0253   RBC 3.06 (L) 05/07/2017 0253   HGB 10.2 (L) 05/07/2017 0253   HCT 30.7 (L) 05/07/2017 0253   PLT 104 (L) 05/07/2017 0253   MCV 100.3 (H) 05/07/2017 0253   MCH 33.3 05/07/2017 0253   MCHC 33.2 05/07/2017 0253   RDW 14.1 05/07/2017 0253   LYMPHSABS 1.9 05/05/2017 1718   MONOABS 1.1 (H) 05/05/2017 1718   EOSABS 0.0 05/05/2017 1718   BASOSABS 0.1 05/05/2017 1718   CMP     Component Value Date/Time   NA 141 05/07/2017 0253   K 3.0 (L) 05/07/2017 0253   CL 112 (H) 05/07/2017 0253   CO2 22 05/07/2017  0253   GLUCOSE 87 05/07/2017 0253   BUN 14 05/07/2017 0253   CREATININE 1.25 (H) 05/07/2017 0253   CALCIUM 8.4 (L) 05/07/2017 0253   PROT 8.0 05/05/2017 1718   ALBUMIN 4.1 05/05/2017 1718   AST 55 (H) 05/05/2017 1718   ALT 29 05/05/2017 1718   ALKPHOS 40 05/05/2017 1718   BILITOT 2.1 (H) 05/05/2017 1718   GFRNONAA 55 (L) 05/07/2017 0253   GFRAA >60 05/07/2017 0253    MRI of the brain was done last night and did not reveal any evidence of an acute stroke or bleed. 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  Assessment 76 year old man with new onset seizure most likely in the setting of alcohol withdrawal. MRI unremarkable for any structural lesion. Hypertension could be another contributing factor in his developing seizures as a result of hypertensive urgency.   Impression New-onset seizure - Secondary to Hypertensive urgency and/or Alcohol withdrawal  Recommendations Management of alcohol withdrawal per the primary team as you are. Minimize sedation as much as possible. CIWA protocol instead of continuous versed drip if tolerated. Management of blood pressures for attainment of normotension per primary team as you are. I would not recommend any antiepileptics at this time as this was a provoked seizure unless he has another seizure.  We will be available as needed. Please call with questions.   Amie Portland, MD [facility] [phone]  If 7pm to 7am, please call on call as listed on AMION.
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
## 2017-05-07 NOTE — Progress Notes (Signed)
29ml versed wasted down the sink, witnessed by Remonia Richter RN

## 2017-05-08 ENCOUNTER — Inpatient Hospital Stay (HOSPITAL_COMMUNITY): Payer: No Typology Code available for payment source

## 2017-05-08 LAB — GLUCOSE, CAPILLARY
GLUCOSE-CAPILLARY: 91 mg/dL (ref 65–99)
GLUCOSE-CAPILLARY: 108 mg/dL — AB (ref 65–99)
Glucose-Capillary: 84 mg/dL (ref 65–99)
Glucose-Capillary: 84 mg/dL (ref 65–99)
Glucose-Capillary: 184 mg/dL — ABNORMAL HIGH (ref 65–99)
Glucose-Capillary: 119 mg/dL — ABNORMAL HIGH (ref 65–99)

## 2017-05-08 LAB — CBC
HCT: 32.4 % — ABNORMAL LOW (ref 39.0–52.0)
Hemoglobin: 10.7 g/dL — ABNORMAL LOW (ref 13.0–17.0)
MCH: 33.2 pg (ref 26.0–34.0)
MCHC: 33 g/dL (ref 30.0–36.0)
MCV: 100.6 fL — ABNORMAL HIGH (ref 78.0–100.0)
PLATELETS: 90 10*3/uL — AB (ref 150–400)
RBC: 3.22 MIL/uL — AB (ref 4.22–5.81)
RDW: 13.6 % (ref 11.5–15.5)
WBC: 6.3 10*3/uL (ref 4.0–10.5)

## 2017-05-08 LAB — BASIC METABOLIC PANEL
ANION GAP: 13 (ref 5–15)
BUN: 13 mg/dL (ref 6–20)
CALCIUM: 8.7 mg/dL — AB (ref 8.9–10.3)
CO2: 17 mmol/L — ABNORMAL LOW (ref 22–32)
Chloride: 113 mmol/L — ABNORMAL HIGH (ref 101–111)
Creatinine, Ser: 1.03 mg/dL (ref 0.61–1.24)
GFR calc Af Amer: >60 mL/min (ref >60)
Glucose, Bld: 87 mg/dL (ref 65–99)
POTASSIUM: 3.2 mmol/L — AB (ref 3.5–5.1)
SODIUM: 143 mmol/L (ref 135–145)

## 2017-05-08 LAB — BLOOD GAS, ARTERIAL
Acid-base deficit: 7.7 mmol/L — ABNORMAL HIGH (ref 0.0–2.0)
BICARBONATE: 16 mmol/L — AB (ref 20.0–28.0)
Drawn by: 51133
FIO2: 40
O2 Saturation: 99.3 %
PCO2 ART: 25.4 mmHg — AB (ref 32.0–48.0)
PEEP/CPAP: 5 cmH2O
PH ART: 7.415 (ref 7.350–7.450)
Patient temperature: 98.6
RATE: 15 resp/min
VT: 550 mL
pO2, Arterial: 172 mmHg — ABNORMAL HIGH (ref 83.0–108.0)

## 2017-05-08 LAB — PHOSPHORUS: Phosphorus: 2.9 mg/dL (ref 2.5–4.6)

## 2017-05-08 LAB — MAGNESIUM: MAGNESIUM: 1.8 mg/dL (ref 1.7–2.4)

## 2017-05-08 MED ORDER — MAGNESIUM SULFATE 2 GM/50ML IV SOLN
2.0000 g | Freq: Once | INTRAVENOUS | Status: AC
  Administered 2017-05-08: 2 g via INTRAVENOUS
  Filled 2017-05-08: qty 50

## 2017-05-08 MED ORDER — DEXTROSE 5 % IV SOLN
30.0000 mmol | Freq: Once | INTRAVENOUS | Status: AC
  Administered 2017-05-08: 30 mmol via INTRAVENOUS
  Filled 2017-05-08 (×2): qty 10

## 2017-05-08 MED ORDER — POTASSIUM CHLORIDE 20 MEQ/15ML (10%) PO SOLN
30.0000 meq | ORAL | Status: AC
  Administered 2017-05-08 (×2): 30 meq
  Filled 2017-05-08 (×2): qty 30

## 2017-05-08 MED ORDER — PANTOPRAZOLE SODIUM 40 MG PO PACK
40.0000 mg | PACK | Freq: Every day | ORAL | Status: DC
  Administered 2017-05-08: 40 mg
  Filled 2017-05-08: qty 20

## 2017-05-08 NOTE — Procedures (Signed)
Extubation Procedure Note  Patient Details:   Name: Tommy Jensen DOB: [birth year] MRN: [id number]   Airway Documentation:     Evaluation  O2 sats: stable throughout Complications: No apparent complications Patient did tolerate procedure well. Bilateral Breath Sounds: Rhonchi   Yes  Patient tolerated wean. MD ordered to extubate. Positive for cuff leak. Patient extubated to a 3 Lpm nasal cannula. No signs of dyspnea or stridor. Patient resting comfortably. RN at bedside.   Ancil Boozer 05/08/2017, 12:14 PM

## 2017-05-08 NOTE — Progress Notes (Signed)
[facility] ADULT ICU REPLACEMENT PROTOCOL FOR AM LAB REPLACEMENT ONLY  The patient does apply for the [facility] Adult ICU Electrolyte Replacment Protocol based on the criteria listed below:   1. Is GFR >/= 40 ml/min? Yes.    Patient's GFR today is >60 2. Is urine output >/= 0.5 ml/kg/hr for the last 6 hours? Yes.   Patient's UOP is 2.9 ml/kg/hr 3. Is BUN < 60 mg/dL? Yes.    Patient's BUN today is 13 4. Abnormal electrolyte(s): K3.2, Mg 1.8 5. Ordered repletion with: per protocol 6. If a panic level lab has been reported, has the CCM MD in charge been notified? Yes.  .   Physician:  Arsenio Loader, MD  Melrose Nakayama 05/08/2017 6:38 AM

## 2017-05-08 NOTE — Progress Notes (Signed)
CSW left both outpatient and inpatient substance abuse treatment options in room with pt for daughter to review.    Claude Manges Karriem Muench, MSW, LCSW-A Emergency Department Clinical Social Worker [phone]

## 2017-05-08 NOTE — Clinical Social Work Note (Signed)
Clinical Social Work Assessment  Patient Details  Name: Tommy Jensen MRN: [id number] Date of Birth: 16-Sep-1940  Date of referral:  05/08/17               Reason for consult:  Substance Use/ETOH Abuse                Permission sought to share information with:  Family Supports Permission granted to share information::  Yes, Verbal Permission Granted  Name::     Firefighter::     Relationship::     Contact Information:     Housing/Transportation Living arrangements for the past 2 months:  Single Family Home (lives alone. ) Source of Information:  Adult Children (daughter Tommy Jensen) Patient Interpreter Needed:  None Criminal Activity/Legal Involvement Pertinent to Current Situation/Hospitalization:  No - Comment as needed Significant Relationships:  Adult Children, Friend Lives with:  Self Do you feel safe going back to the place where you live?  Yes Need for family participation in patient care:  Yes (Comment)  Care giving concerns:  CSW spoke with pt's daughter Tommy Jensen to gather information for assessment. During this time CSW was made aware of concerns about pt's drinking and potential rehab for pt.    Social Worker assessment / plan:  CSW spoke with pt's daughter Tommy Jensen. CSW was informed that pt has lived alone for over 40 years and has a girlfriend but isnt locally. Daughter is pt's only support, however pt does have a son that lives in [place]. Pt's daughter is seeking rehab options for pt and is agreeable to looking into them when pt is more stable. CSW will provide family with outpatient resources for ETOH rehab.   Employment status:  Retired Database administrator PT Recommendations:  Not assessed at this time Information / Referral to community resources:   (Substance Abuse Resources.)  Patient/Family's Response to care:  Pt's daughter is understanding and agreeable to CSW plan of care at this time.  Patient/Family's Understanding of and  Emotional Response to Diagnosis, Current Treatment, and Prognosis:  No further questions or concerns have been presented at this time.  Emotional Assessment Appearance:  Appears stated age Attitude/Demeanor/Rapport:  Unable to Assess Affect (typically observed):  Unable to Assess Orientation:   (pt is inubated at this time. ) Alcohol / Substance use:  Alcohol Use Psych involvement (Current and /or in the community):  No (Comment)  Discharge Needs  Concerns to be addressed:  No discharge needs identified Readmission within the last 30 days:  No Current discharge risk:  None Barriers to Discharge:  No Barriers Identified   Robb Matar, LCSWA 05/08/2017, 7:51 AM
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
## 2017-05-09 ENCOUNTER — Inpatient Hospital Stay (HOSPITAL_COMMUNITY): Payer: No Typology Code available for payment source

## 2017-05-09 LAB — CBC
HEMATOCRIT: 30.5 % — AB (ref 39.0–52.0)
HEMOGLOBIN: 10.2 g/dL — AB (ref 13.0–17.0)
MCH: 32.8 pg (ref 26.0–34.0)
MCHC: 33.4 g/dL (ref 30.0–36.0)
MCV: 98.1 fL (ref 78.0–100.0)
Platelets: 107 10*3/uL — ABNORMAL LOW (ref 150–400)
RBC: 3.11 MIL/uL — AB (ref 4.22–5.81)
RDW: 13.2 % (ref 11.5–15.5)
WBC: 7 10*3/uL (ref 4.0–10.5)

## 2017-05-09 LAB — GLUCOSE, CAPILLARY
GLUCOSE-CAPILLARY: 108 mg/dL — AB (ref 65–99)
GLUCOSE-CAPILLARY: 122 mg/dL — AB (ref 65–99)
Glucose-Capillary: 138 mg/dL — ABNORMAL HIGH (ref 65–99)
Glucose-Capillary: 114 mg/dL — ABNORMAL HIGH (ref 65–99)
Glucose-Capillary: 125 mg/dL — ABNORMAL HIGH (ref 65–99)

## 2017-05-09 LAB — BASIC METABOLIC PANEL
Anion gap: 8 (ref 5–15)
BUN: 9 mg/dL (ref 6–20)
CHLORIDE: 108 mmol/L (ref 101–111)
CO2: 22 mmol/L (ref 22–32)
Calcium: 8.7 mg/dL — ABNORMAL LOW (ref 8.9–10.3)
Creatinine, Ser: 0.86 mg/dL (ref 0.61–1.24)
GFR calc Af Amer: >60 mL/min (ref >60)
GFR calc non Af Amer: >60 mL/min (ref >60)
GLUCOSE: 112 mg/dL — AB (ref 65–99)
POTASSIUM: 3.5 mmol/L (ref 3.5–5.1)
SODIUM: 138 mmol/L (ref 135–145)

## 2017-05-09 LAB — CULTURE, RESPIRATORY: CULTURE: NORMAL

## 2017-05-09 LAB — BLOOD GAS, ARTERIAL
Acid-base deficit: 1.9 mmol/L (ref 0.0–2.0)
BICARBONATE: 21.4 mmol/L (ref 20.0–28.0)
Drawn by: 41977
FIO2: 21
O2 Saturation: 93.1 %
PH ART: 7.458 — AB (ref 7.350–7.450)
Patient temperature: 98.6
pCO2 arterial: 30.7 mmHg — ABNORMAL LOW (ref 32.0–48.0)
pO2, Arterial: 63.9 mmHg — ABNORMAL LOW (ref 83.0–108.0)

## 2017-05-09 LAB — CULTURE, RESPIRATORY W GRAM STAIN

## 2017-05-09 LAB — PHOSPHORUS: Phosphorus: 3.9 mg/dL (ref 2.5–4.6)

## 2017-05-09 LAB — MAGNESIUM: MAGNESIUM: 1.8 mg/dL (ref 1.7–2.4)

## 2017-05-09 MED ORDER — AMLODIPINE BESYLATE 5 MG PO TABS
5.0000 mg | ORAL_TABLET | Freq: Every day | ORAL | Status: DC
  Administered 2017-05-09 – 2017-05-10 (×2): 5 mg via ORAL
  Filled 2017-05-09 (×2): qty 1

## 2017-05-09 MED ORDER — VITAMIN B-1 100 MG PO TABS
100.0000 mg | ORAL_TABLET | Freq: Every day | ORAL | Status: DC
  Administered 2017-05-10 – 2017-05-12 (×3): 100 mg via ORAL
  Filled 2017-05-09 (×3): qty 1

## 2017-05-09 MED ORDER — DOCUSATE SODIUM 100 MG PO CAPS
100.0000 mg | ORAL_CAPSULE | Freq: Two times a day (BID) | ORAL | Status: DC
  Filled 2017-05-09: qty 1

## 2017-05-09 MED ORDER — DOCUSATE SODIUM 100 MG PO CAPS
100.0000 mg | ORAL_CAPSULE | Freq: Two times a day (BID) | ORAL | Status: DC | PRN
  Administered 2017-05-09: 100 mg via ORAL
  Filled 2017-05-09 (×2): qty 1

## 2017-05-09 MED ORDER — FOLIC ACID 1 MG PO TABS
1.0000 mg | ORAL_TABLET | Freq: Every day | ORAL | Status: DC
  Administered 2017-05-10 – 2017-05-12 (×3): 1 mg via ORAL
  Filled 2017-05-09 (×3): qty 1

## 2017-05-09 MED ORDER — LORAZEPAM 2 MG/ML IJ SOLN
2.0000 mg | INTRAMUSCULAR | Status: DC | PRN
  Administered 2017-05-10 – 2017-05-12 (×5): 2 mg via INTRAVENOUS
  Filled 2017-05-09 (×3): qty 1

## 2017-05-09 MED ORDER — LOSARTAN POTASSIUM 50 MG PO TABS
100.0000 mg | ORAL_TABLET | Freq: Every day | ORAL | Status: DC
  Administered 2017-05-09 – 2017-05-11 (×3): 100 mg via ORAL
  Filled 2017-05-09 (×3): qty 2

## 2017-05-09 NOTE — Progress Notes (Signed)
NURSING PROGRESS NOTE  Tommy Jensen [id number] Transfer Data: 05/09/2017 7:03 PM Attending Provider: Alyson Reedy, MD [id number], No Pcp Per Code Status: FULL   Tommy Jensen is a 76 y.o. male patient transferred from 67M  -No acute distress noted.  -No complaints of shortness of breath.  -No complaints of chest pain.   Cardiac Monitoring: Box # 22 in place. Cardiac monitor yields:normal sinus rhythm/ST  Last Documented Vital Jensen: Blood pressure (!) 173/96, pulse (!) 112, temperature 98.7 F (37.1 C), temperature source Oral, resp. rate (!) 21, height 5\' 7"  (1.702 m), weight 68.7 kg (151 lb 6.4 oz), SpO2 98 %.  IV Fluids:  IV in place, occlusive dsg intact without redness, IV cath forearm left, condition patent and no redness IV push only, no IV fluids.   Allergies:  Ace inhibitors; Augmentin [amoxicillin-pot clavulanate]; Lovastatin; Pravastatin; and Wellbutrin [bupropion]  Past Medical History:   has a past medical history of Glaucoma; Hypercholesteremia; and Hypertension.  Past Surgical History:   has no past surgical history on file.  Social History:   reports that he has never smoked. He has never used smokeless tobacco. He reports that he does not drink alcohol or use drugs.  Skin: intact except where otherwise charted  Patient/Family orientated to room. Information packet given to patient/family. Admission inpatient armband information verified with patient/family to include name and date of birth and placed on patient arm. Side rails up x 2, fall assessment and education completed with patient/family. Patient/family able to verbalize understanding of risk associated with falls and verbalized understanding to call for assistance before getting out of bed. Call light within reach. Patient/family able to voice and demonstrate understanding of unit orientation instructions.
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
## 2017-05-10 DIAGNOSIS — E876 Hypokalemia: Secondary | ICD-10-CM

## 2017-05-10 DIAGNOSIS — R748 Abnormal levels of other serum enzymes: Secondary | ICD-10-CM

## 2017-05-10 DIAGNOSIS — K922 Gastrointestinal hemorrhage, unspecified: Secondary | ICD-10-CM

## 2017-05-10 DIAGNOSIS — F10239 Alcohol dependence with withdrawal, unspecified: Secondary | ICD-10-CM

## 2017-05-10 DIAGNOSIS — R7989 Other specified abnormal findings of blood chemistry: Secondary | ICD-10-CM

## 2017-05-10 LAB — CULTURE, BLOOD (ROUTINE X 2)
CULTURE: NO GROWTH
Culture: NO GROWTH
SPECIAL REQUESTS: ADEQUATE
Special Requests: ADEQUATE

## 2017-05-10 LAB — GLUCOSE, CAPILLARY
GLUCOSE-CAPILLARY: 122 mg/dL — AB (ref 65–99)
GLUCOSE-CAPILLARY: 122 mg/dL — AB (ref 65–99)
GLUCOSE-CAPILLARY: 124 mg/dL — AB (ref 65–99)
GLUCOSE-CAPILLARY: 152 mg/dL — AB (ref 65–99)
Glucose-Capillary: 141 mg/dL — ABNORMAL HIGH (ref 65–99)
Glucose-Capillary: 125 mg/dL — ABNORMAL HIGH (ref 65–99)
Glucose-Capillary: 124 mg/dL — ABNORMAL HIGH (ref 65–99)

## 2017-05-10 MED ORDER — BISACODYL 10 MG RE SUPP
10.0000 mg | Freq: Every day | RECTAL | Status: AC
  Administered 2017-05-10 – 2017-05-11 (×2): 10 mg via RECTAL
  Filled 2017-05-10 (×3): qty 1

## 2017-05-10 MED ORDER — SENNOSIDES-DOCUSATE SODIUM 8.6-50 MG PO TABS
1.0000 | ORAL_TABLET | Freq: Two times a day (BID) | ORAL | Status: DC
  Administered 2017-05-10 – 2017-05-12 (×5): 1 via ORAL
  Filled 2017-05-10 (×5): qty 1

## 2017-05-10 MED ORDER — FAMOTIDINE 20 MG PO TABS
10.0000 mg | ORAL_TABLET | Freq: Two times a day (BID) | ORAL | Status: DC
  Administered 2017-05-10 – 2017-05-12 (×4): 10 mg via ORAL
  Filled 2017-05-10 (×4): qty 1

## 2017-05-10 MED ORDER — POTASSIUM CHLORIDE CRYS ER 20 MEQ PO TBCR
40.0000 meq | EXTENDED_RELEASE_TABLET | Freq: Once | ORAL | Status: AC
  Administered 2017-05-10: 40 meq via ORAL
  Filled 2017-05-10: qty 2

## 2017-05-10 MED ORDER — POLYETHYLENE GLYCOL 3350 17 G PO PACK
17.0000 g | PACK | Freq: Every day | ORAL | Status: DC
  Administered 2017-05-10 – 2017-05-12 (×3): 17 g via ORAL
  Filled 2017-05-10 (×3): qty 1

## 2017-05-10 MED ORDER — METOPROLOL TARTRATE 12.5 MG HALF TABLET
12.5000 mg | ORAL_TABLET | Freq: Two times a day (BID) | ORAL | Status: DC
  Administered 2017-05-10 – 2017-05-11 (×2): 12.5 mg via ORAL
  Filled 2017-05-10 (×2): qty 1
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
## 2017-05-11 ENCOUNTER — Inpatient Hospital Stay (HOSPITAL_COMMUNITY): Payer: No Typology Code available for payment source

## 2017-05-11 ENCOUNTER — Inpatient Hospital Stay (HOSPITAL_COMMUNITY): Payer: Medicare Other

## 2017-05-11 DIAGNOSIS — I361 Nonrheumatic tricuspid (valve) insufficiency: Secondary | ICD-10-CM

## 2017-05-11 DIAGNOSIS — I340 Nonrheumatic mitral (valve) insufficiency: Secondary | ICD-10-CM

## 2017-05-11 LAB — COMPREHENSIVE METABOLIC PANEL
ALT: 18 U/L (ref 17–63)
AST: 28 U/L (ref 15–41)
Albumin: 3.4 g/dL — ABNORMAL LOW (ref 3.5–5.0)
Alkaline Phosphatase: 41 U/L (ref 38–126)
Anion gap: 10 (ref 5–15)
BILIRUBIN TOTAL: 1.4 mg/dL — AB (ref 0.3–1.2)
BUN: 10 mg/dL (ref 6–20)
CO2: 23 mmol/L (ref 22–32)
Calcium: 9.2 mg/dL (ref 8.9–10.3)
Chloride: 102 mmol/L (ref 101–111)
Creatinine, Ser: 0.9 mg/dL (ref 0.61–1.24)
Glucose, Bld: 116 mg/dL — ABNORMAL HIGH (ref 65–99)
POTASSIUM: 3.5 mmol/L (ref 3.5–5.1)
Sodium: 135 mmol/L (ref 135–145)
TOTAL PROTEIN: 7.2 g/dL (ref 6.5–8.1)

## 2017-05-11 LAB — TSH: TSH: 2.95 u[IU]/mL (ref 0.350–4.500)

## 2017-05-11 LAB — VITAMIN B12: Vitamin B-12: 2361 pg/mL — ABNORMAL HIGH (ref 180–914)

## 2017-05-11 LAB — CBC
HEMATOCRIT: 33.9 % — AB (ref 39.0–52.0)
Hemoglobin: 11.7 g/dL — ABNORMAL LOW (ref 13.0–17.0)
MCH: 33.7 pg (ref 26.0–34.0)
MCHC: 34.5 g/dL (ref 30.0–36.0)
MCV: 97.7 fL (ref 78.0–100.0)
Platelets: 126 10*3/uL — ABNORMAL LOW (ref 150–400)
RBC: 3.47 MIL/uL — ABNORMAL LOW (ref 4.22–5.81)
RDW: 13.4 % (ref 11.5–15.5)
WBC: 10.2 10*3/uL (ref 4.0–10.5)

## 2017-05-11 LAB — GLUCOSE, CAPILLARY
GLUCOSE-CAPILLARY: 161 mg/dL — AB (ref 65–99)
GLUCOSE-CAPILLARY: 110 mg/dL — AB (ref 65–99)
Glucose-Capillary: 87 mg/dL (ref 65–99)
Glucose-Capillary: 103 mg/dL — ABNORMAL HIGH (ref 65–99)

## 2017-05-11 LAB — LIPID PANEL
Cholesterol: 129 mg/dL (ref 0–200)
HDL: 44 mg/dL (ref >40)
LDL CALC: 68 mg/dL (ref 0–99)
TRIGLYCERIDES: 87 mg/dL (ref <150)
Total CHOL/HDL Ratio: 2.9 RATIO
VLDL: 17 mg/dL (ref 0–40)

## 2017-05-11 LAB — IRON AND TIBC
IRON: 28 ug/dL — AB (ref 45–182)
SATURATION RATIOS: 14 % — AB (ref 17.9–39.5)
TIBC: 207 ug/dL — AB (ref 250–450)
UIBC: 179 ug/dL

## 2017-05-11 LAB — ECHOCARDIOGRAM COMPLETE
HEIGHTINCHES: 67 in
Weight: 2304 oz

## 2017-05-11 LAB — OCCULT BLOOD X 1 CARD TO LAB, STOOL: Fecal Occult Bld: NEGATIVE

## 2017-05-11 LAB — RPR: RPR: NONREACTIVE

## 2017-05-11 LAB — FOLATE: Folate: 36 ng/mL (ref >5.9)

## 2017-05-11 LAB — HIV ANTIBODY (ROUTINE TESTING W REFLEX): HIV Screen 4th Generation wRfx: NONREACTIVE

## 2017-05-11 MED ORDER — ADULT MULTIVITAMIN W/MINERALS CH
1.0000 | ORAL_TABLET | Freq: Every day | ORAL | Status: DC
  Administered 2017-05-11 – 2017-05-12 (×2): 1 via ORAL
  Filled 2017-05-11 (×2): qty 1

## 2017-05-11 MED ORDER — BOOST / RESOURCE BREEZE PO LIQD
1.0000 | Freq: Three times a day (TID) | ORAL | Status: DC
  Administered 2017-05-11 – 2017-05-12 (×3): 1 via ORAL

## 2017-05-11 MED ORDER — METOPROLOL TARTRATE 25 MG PO TABS
25.0000 mg | ORAL_TABLET | Freq: Two times a day (BID) | ORAL | Status: DC
  Administered 2017-05-11 – 2017-05-12 (×2): 25 mg via ORAL
  Filled 2017-05-11 (×2): qty 1

## 2017-05-11 MED ORDER — HALOPERIDOL LACTATE 5 MG/ML IJ SOLN
2.5000 mg | Freq: Once | INTRAMUSCULAR | Status: AC
  Administered 2017-05-11: 2.5 mg via INTRAVENOUS
  Filled 2017-05-11: qty 1

## 2017-05-11 MED ORDER — MAGNESIUM OXIDE 400 (241.3 MG) MG PO TABS
400.0000 mg | ORAL_TABLET | Freq: Every day | ORAL | Status: DC
  Administered 2017-05-12: 400 mg via ORAL
  Filled 2017-05-11: qty 1

## 2017-05-11 MED ORDER — LOSARTAN POTASSIUM 50 MG PO TABS
50.0000 mg | ORAL_TABLET | Freq: Every day | ORAL | Status: DC
  Administered 2017-05-12: 50 mg via ORAL
  Filled 2017-05-11: qty 1

## 2017-05-11 NOTE — Progress Notes (Signed)
  Echocardiogram 2D Echocardiogram has been performed.  Tommy Jensen [name] 05/11/2017, 11:37 AM
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
## 2017-05-11 NOTE — NC FL2 (Signed)
[place] MEDICAID FL2 LEVEL OF CARE SCREENING TOOL     IDENTIFICATION  Patient Name: Tommy Jensen Birthdate: [birth year] Sex: male Admission Date (Current Location): 05/05/2017  [facility] and [place] Number:  Producer, television/film/video and Address:  [facility], [address] [place], [place] [address]      Provider Number: [id number]  Attending Physician Name and Address:  Albertine Grates, MD  Relative Name and Phone Number:  Shanda Bumps, daughter, [phone]    Current Level of Care: Hospital Recommended Level of Care: Skilled Nursing Facility Prior Approval Number:    Date Approved/Denied:   PASRR Number: [id number]  Discharge Plan: SNF    Current Diagnoses: Patient Active Problem List   Diagnosis Date Noted  . Alcohol withdrawal syndrome, with delirium (HCC)   . Acute respiratory failure with hypoxia (HCC)   . New onset seizure (HCC) 05/05/2017  . Hypertension 05/05/2017  . Hypercholesteremia 05/05/2017  . Glaucoma 05/05/2017  . Seizures (HCC) 05/05/2017  . Seizure (HCC) 05/05/2017    Orientation RESPIRATION BLADDER Height & Weight     Self, Place  Normal Continent Weight: 65.3 kg (144 lb) Height:  5\' 7"  (170.2 cm)  BEHAVIORAL SYMPTOMS/MOOD NEUROLOGICAL BOWEL NUTRITION STATUS      Continent Diet (Please see DC Summary)  AMBULATORY STATUS COMMUNICATION OF NEEDS Skin   Limited Assist Verbally Normal                       Personal Care Assistance Level of Assistance  Bathing, Feeding, Dressing Bathing Assistance: Limited assistance Feeding assistance: Limited assistance Dressing Assistance: Limited assistance     Functional Limitations Info             SPECIAL CARE FACTORS FREQUENCY  PT (By licensed PT), OT (By licensed OT)     PT Frequency: 5x/week OT Frequency: 3x/week            Contractures      Additional Factors Info  Code Status, Allergies Code Status Info: Full Allergies Info:  Ace Inhibitors, Augmentin  Amoxicillin-pot Clavulanate, Lovastatin, Pravastatin, Wellbutrin Bupropion           Current Medications (05/11/2017):  This is the current hospital active medication list Current Facility-Administered Medications  Medication Dose Route Frequency Provider Last Rate Last Dose  . 0.9 %  sodium chloride infusion  250 mL Intravenous PRN Desai, Rahul P, PA-C      . bisacodyl (DULCOLAX) suppository 10 mg  10 mg Rectal Daily Albertine Grates, MD   10 mg at 05/10/17 2119  . dexmedetomidine (PRECEDEX) 400 MCG/100ML (4 mcg/mL) infusion  0-1.2 mcg/kg/hr Intravenous Titrated Alyson Reedy, MD   Stopped at 05/09/17 0419  . famotidine (PEPCID) tablet 10 mg  10 mg Oral BID Albertine Grates, MD   10 mg at 05/10/17 2119  . folic acid (FOLVITE) tablet 1 mg  1 mg Oral Daily Alyson Reedy, MD   1 mg at 05/10/17 0935  . LORazepam (ATIVAN) injection 1-2 mg  1-2 mg Intravenous Q2H PRN Desai, Rahul P, PA-C      . LORazepam (ATIVAN) injection 2-3 mg  2-3 mg Intravenous Q1H PRN Bobette Mo, MD   2 mg at 05/11/17 0440  . losartan (COZAAR) tablet 100 mg  100 mg Oral Daily Alyson Reedy, MD   100 mg at 05/10/17 0936  . metoprolol tartrate (LOPRESSOR) tablet 12.5 mg  12.5 mg Oral BID Albertine Grates, MD   12.5 mg at  05/10/17 1826  . multivitamin with minerals tablet 1 tablet  1 tablet Per Tube Daily Nolen Mu, [facility]   1 tablet at 05/10/17 0935  . polyethylene glycol (MIRALAX / GLYCOLAX) packet 17 g  17 g Oral Daily Albertine Grates, MD   17 g at 05/10/17 1417  . senna-docusate (Senokot-S) tablet 1 tablet  1 tablet Oral BID Albertine Grates, MD   1 tablet at 05/10/17 2119  . thiamine (VITAMIN B-1) tablet 100 mg  100 mg Oral Daily Alyson Reedy, MD   100 mg at 05/10/17 5465     Discharge Medications: Please see discharge summary for a list of discharge medications.  Relevant Imaging Results:  Relevant Lab Results:   Additional Information SSN: [id number] [place], [place]
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
## 2017-05-11 NOTE — Progress Notes (Signed)
CSW spoke with patient's daughter and son regarding discharge planning. They would like for patient to discharge to ETOH rehab. CSW explained that patient has been recommended for physical therapy rehab at [facility]. They would like to pursue SNF placement before ETOH rehab. CSW offered choices. Son will review list and contact me back.  Osborne Casco Iriana Artley LCSWA [phone]

## 2017-05-12 DIAGNOSIS — K5900 Constipation, unspecified: Secondary | ICD-10-CM | POA: Diagnosis not present

## 2017-05-12 DIAGNOSIS — F1099 Alcohol use, unspecified with unspecified alcohol-induced disorder: Secondary | ICD-10-CM

## 2017-05-12 DIAGNOSIS — M5136 Other intervertebral disc degeneration, lumbar region: Secondary | ICD-10-CM | POA: Diagnosis not present

## 2017-05-12 DIAGNOSIS — F10231 Alcohol dependence with withdrawal delirium: Secondary | ICD-10-CM | POA: Diagnosis not present

## 2017-05-12 DIAGNOSIS — E785 Hyperlipidemia, unspecified: Secondary | ICD-10-CM | POA: Diagnosis not present

## 2017-05-12 DIAGNOSIS — G9340 Encephalopathy, unspecified: Secondary | ICD-10-CM | POA: Diagnosis not present

## 2017-05-12 DIAGNOSIS — I10 Essential (primary) hypertension: Secondary | ICD-10-CM | POA: Diagnosis not present

## 2017-05-12 DIAGNOSIS — K922 Gastrointestinal hemorrhage, unspecified: Secondary | ICD-10-CM | POA: Diagnosis not present

## 2017-05-12 DIAGNOSIS — R9082 White matter disease, unspecified: Secondary | ICD-10-CM | POA: Diagnosis not present

## 2017-05-12 DIAGNOSIS — M6281 Muscle weakness (generalized): Secondary | ICD-10-CM | POA: Diagnosis not present

## 2017-05-12 DIAGNOSIS — R569 Unspecified convulsions: Secondary | ICD-10-CM | POA: Diagnosis not present

## 2017-05-12 DIAGNOSIS — D649 Anemia, unspecified: Secondary | ICD-10-CM | POA: Diagnosis not present

## 2017-05-12 DIAGNOSIS — R748 Abnormal levels of other serum enzymes: Secondary | ICD-10-CM | POA: Diagnosis not present

## 2017-05-12 DIAGNOSIS — H409 Unspecified glaucoma: Secondary | ICD-10-CM | POA: Diagnosis not present

## 2017-05-12 DIAGNOSIS — N179 Acute kidney failure, unspecified: Secondary | ICD-10-CM

## 2017-05-12 DIAGNOSIS — J9601 Acute respiratory failure with hypoxia: Secondary | ICD-10-CM | POA: Diagnosis not present

## 2017-05-12 DIAGNOSIS — K219 Gastro-esophageal reflux disease without esophagitis: Secondary | ICD-10-CM | POA: Diagnosis not present

## 2017-05-12 DIAGNOSIS — R7989 Other specified abnormal findings of blood chemistry: Secondary | ICD-10-CM | POA: Diagnosis not present

## 2017-05-12 LAB — BASIC METABOLIC PANEL
ANION GAP: 9 (ref 5–15)
BUN: 12 mg/dL (ref 6–20)
CALCIUM: 9.1 mg/dL (ref 8.9–10.3)
CO2: 25 mmol/L (ref 22–32)
CREATININE: 0.85 mg/dL (ref 0.61–1.24)
Chloride: 101 mmol/L (ref 101–111)
GFR calc Af Amer: >60 mL/min (ref >60)
GLUCOSE: 104 mg/dL — AB (ref 65–99)
Potassium: 3.2 mmol/L — ABNORMAL LOW (ref 3.5–5.1)
Sodium: 135 mmol/L (ref 135–145)

## 2017-05-12 LAB — HEPATITIS PANEL, ACUTE
HCV Ab: <0.1 {s_co_ratio} (ref 0.0–0.9)
Hep A IgM: NEGATIVE
Hep B C IgM: NEGATIVE
Hepatitis B Surface Ag: NEGATIVE

## 2017-05-12 LAB — MAGNESIUM: Magnesium: 1.9 mg/dL (ref 1.7–2.4)

## 2017-05-12 MED ORDER — GABAPENTIN 100 MG PO CAPS: 100.0000 mg | ORAL_CAPSULE | Freq: Two times a day (BID) | ORAL | 0 refills | Status: DC

## 2017-05-12 MED ORDER — POLYETHYLENE GLYCOL 3350 17 G PO PACK: 17.0000 g | PACK | Freq: Every day | ORAL | 0 refills | Status: DC

## 2017-05-12 MED ORDER — LOSARTAN POTASSIUM 50 MG PO TABS: 50.0000 mg | ORAL_TABLET | Freq: Every day | ORAL | 0 refills | Status: DC

## 2017-05-12 MED ORDER — THIAMINE HCL 100 MG PO TABS: 100.0000 mg | ORAL_TABLET | Freq: Every day | ORAL | 0 refills | Status: DC

## 2017-05-12 MED ORDER — SENNOSIDES-DOCUSATE SODIUM 8.6-50 MG PO TABS: 1.0000 | ORAL_TABLET | Freq: Every day | ORAL | 0 refills | Status: DC

## 2017-05-12 MED ORDER — MAGNESIUM OXIDE 400 (241.3 MG) MG PO TABS: 400.0000 mg | ORAL_TABLET | Freq: Every day | ORAL | 0 refills | Status: AC

## 2017-05-12 MED ORDER — POTASSIUM CHLORIDE CRYS ER 20 MEQ PO TBCR
40.0000 meq | EXTENDED_RELEASE_TABLET | Freq: Every day | ORAL | Status: DC
  Administered 2017-05-12: 40 meq via ORAL
  Filled 2017-05-12: qty 2

## 2017-05-12 MED ORDER — POTASSIUM CHLORIDE CRYS ER 20 MEQ PO TBCR: 40.0000 meq | EXTENDED_RELEASE_TABLET | Freq: Every day | ORAL | 0 refills | Status: DC

## 2017-05-12 MED ORDER — ADULT MULTIVITAMIN W/MINERALS CH: 1.0000 | ORAL_TABLET | Freq: Every day | ORAL | 0 refills | Status: DC

## 2017-05-12 MED ORDER — FOLIC ACID 1 MG PO TABS: 1.0000 mg | ORAL_TABLET | Freq: Every day | ORAL | 0 refills | Status: DC

## 2017-05-12 MED ORDER — POTASSIUM CHLORIDE CRYS ER 20 MEQ PO TBCR
40.0000 meq | EXTENDED_RELEASE_TABLET | Freq: Once | ORAL | Status: AC
  Administered 2017-05-12: 40 meq via ORAL
  Filled 2017-05-12: qty 2

## 2017-05-12 MED ORDER — BOOST / RESOURCE BREEZE PO LIQD: 1.0000 | Freq: Three times a day (TID) | ORAL | 0 refills | Status: DC

## 2017-05-12 MED ORDER — LORAZEPAM 1 MG PO TABS
1.0000 mg | ORAL_TABLET | ORAL | Status: DC | PRN
  Administered 2017-05-12 (×2): 1 mg via ORAL
  Filled 2017-05-12 (×3): qty 1

## 2017-05-12 MED ORDER — METOPROLOL TARTRATE 25 MG PO TABS: 25.0000 mg | ORAL_TABLET | Freq: Two times a day (BID) | ORAL | 0 refills | Status: DC

## 2017-05-12 NOTE — Progress Notes (Signed)
Patient will DC to: [facility] Anticipated DC date: 05/12/17 Family notified: Son Transport by: By car 3pm   Per MD patient ready for DC to [facility]. RN, patient, patient's family, and facility notified of DC. Discharge Summary sent to facility. RN given number for report [phone]).   CSW signing off.  Cristobal Goldmann, [place] Clinical Social Worker [phone]
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
## 2017-05-12 NOTE — Clinical Social Work Placement (Signed)
   CLINICAL SOCIAL WORK PLACEMENT  NOTE  Date:  05/12/2017  Patient Details  Name: Tommy Jensen MRN: [id number] Date of Birth: 12-12-1940  Clinical Social Work is seeking post-discharge placement for this patient at the Skilled  Nursing Facility level of care (*CSW will initial, date and re-position this form in  chart as items are completed):  Yes   Patient/family provided with [place] Clinical Social Work Department's list of facilities offering this level of care within the geographic area requested by the patient (or if unable, by the patient's family).  Yes   Patient/family informed of their freedom to choose among providers that offer the needed level of care, that participate in Medicare, Medicaid or managed care program needed by the patient, have an available bed and are willing to accept the patient.  Yes   Patient/family informed of [facility]'s ownership interest in [facility] and [facility], as well as of the fact that they are under no obligation to receive care at these facilities.  PASRR submitted to EDS on 05/11/17     PASRR number received on 05/11/17     Existing PASRR number confirmed on       FL2 transmitted to all facilities in geographic area requested by pt/family on 05/11/17     FL2 transmitted to all facilities within larger geographic area on       Patient informed that his/her managed care company has contracts with or will negotiate with certain facilities, including the following:        Yes   Patient/family informed of bed offers received.  Patient chooses bed at [facility]     Physician recommends and patient chooses bed at      Patient to be transferred to [facility] on 05/12/17.  Patient to be transferred to facility by car     Patient family notified on 05/12/17 of transfer.  Name of family member notified:  Adin Hector     PHYSICIAN       Additional Comment:     _______________________________________________ Mearl Latin, LCSWA 05/12/2017, 1:25 PM

## 2017-05-12 NOTE — Care Management Note (Signed)
Case Management Note  Patient Details  Name: Tommy Jensen MRN: [id number] Date of Birth: 03/13/1941  Subjective/Objective:       Admitted with new onset seizures.            Mondrell Anglade (Daughter) Thomasenia Bottoms     [phone]       Action/Plan: Plan is to d/c to SNF today. CSW managing disposition to facility. No present needs identified per CM.  Expected Discharge Date:   05/12/2017          Expected Discharge Plan:  Skilled Nursing Facility  In-House Referral:  Clinical Social Work  Discharge planning Services  CM Consult  Post Acute Care Choice:    Choice offered to:     DME Arranged:    DME Agency:     HH Arranged:    HH Agency:     Status of Service:  Completed, signed off  If discussed at [employer], dates discussed:    Additional Comments:  Epifanio Lesches, RN 05/12/2017, 10:57 AM

## 2017-05-12 NOTE — Care Management Important Message (Signed)
Important Message  Patient Details  Name: Tommy Jensen MRN: [id number] Date of Birth: 09-29-1940   Medicare Important Message Given:  Yes    Orange Hilligoss Abena 05/12/2017, 10:12 AM

## 2017-05-12 NOTE — Progress Notes (Signed)
Tommy Jensen to be D/C'd Skilled nursing facility per MD order.  Discussed with the patient and all questions fully answered.  VSS, Skin clean, dry and intact without evidence of skin break down, no evidence of skin tears noted. IV catheter discontinued intact. Site without signs and symptoms of complications. Dressing and pressure applied.  An After Visit Summary was printed and given to the patient. Patient received prescription.  D/c education completed with patient/family including follow up instructions, medication list, d/c activities limitations if indicated, with other d/c instructions as indicated by MD - patient able to verbalize understanding, all questions fully answered.   Patient instructed to return to ED, call 911, or call MD for any changes in condition.   Patient escorted via WC, and D/C to facility via private auto. Allergies as of 05/12/2017      Reactions   Ace Inhibitors    Augmentin [amoxicillin-pot Clavulanate]    Lovastatin    Pravastatin    Wellbutrin [bupropion]       Medication List    STOP taking these medications   amLODipine 5 MG tablet Commonly known as:  NORVASC   oxyCODONE-acetaminophen 5-325 MG tablet Commonly known as:  PERCOCET/ROXICET     TAKE these medications   dorzolamide-timolol 22.3-6.8 MG/ML ophthalmic solution Commonly known as:  COSOPT Place 1 drop into the right eye 2 (two) times daily.   feeding supplement Liqd Take 1 Container by mouth 3 (three) times daily between meals.   folic acid 1 MG tablet Commonly known as:  FOLVITE Take 1 tablet (1 mg total) by mouth daily.   gabapentin 100 MG capsule Commonly known as:  NEURONTIN Take 1 capsule (100 mg total) by mouth 2 (two) times daily. What changed:  when to take this   latanoprost 0.005 % ophthalmic solution Commonly known as:  XALATAN Place 1 drop into both eyes daily.   losartan 50 MG tablet Commonly known as:  COZAAR Take 1 tablet (50 mg total) by mouth  daily. What changed:  medication strength  how much to take   magnesium oxide 400 (241.3 Mg) MG tablet Commonly known as:  MAG-OX Take 1 tablet (400 mg total) by mouth daily.   metoprolol tartrate 25 MG tablet Commonly known as:  LOPRESSOR Take 1 tablet (25 mg total) by mouth 2 (two) times daily.   multivitamin with minerals Tabs tablet Take 1 tablet by mouth daily.   pantoprazole 40 MG tablet Commonly known as:  PROTONIX Take 40 mg by mouth daily.   polyethylene glycol packet Commonly known as:  MIRALAX / GLYCOLAX Take 17 g by mouth daily.   potassium chloride SA 20 MEQ tablet Commonly known as:  K-DUR,KLOR-CON Take 2 tablets (40 mEq total) by mouth daily.   senna-docusate 8.6-50 MG tablet Commonly known as:  Senokot-S Take 1 tablet by mouth at bedtime. Hold if diarrhea, report to md if diarrhea   thiamine 100 MG tablet Take 1 tablet (100 mg total) by mouth daily.            Discharge Care Instructions        Start     Ordered   05/13/17 0000  folic acid (FOLVITE) 1 MG tablet  Daily     05/12/17 1206   05/13/17 0000  thiamine 100 MG tablet  Daily     05/12/17 1206   05/13/17 0000  losartan (COZAAR) 50 MG tablet  Daily     05/12/17 1213   05/13/17 0000  magnesium oxide (  MAG-OX) 400 (241.3 Mg) MG tablet  Daily     05/12/17 1213   05/13/17 0000  polyethylene glycol (MIRALAX / GLYCOLAX) packet  Daily     05/12/17 1213   05/12/17 0000  feeding supplement (BOOST / RESOURCE BREEZE) LIQD  3 times daily between meals     05/12/17 1206   05/12/17 0000  Increase activity slowly     05/12/17 1206   05/12/17 0000  Diet - low sodium heart healthy     05/12/17 1206   05/12/17 0000  gabapentin (NEURONTIN) 100 MG capsule  2 times daily     05/12/17 1213   05/12/17 0000  metoprolol tartrate (LOPRESSOR) 25 MG tablet  2 times daily     05/12/17 1213   05/12/17 0000  Multiple Vitamin (MULTIVITAMIN WITH MINERALS) TABS tablet  Daily     05/12/17 1213   05/12/17 0000   senna-docusate (SENOKOT-S) 8.6-50 MG tablet  Daily at bedtime     05/12/17 1213   05/12/17 0000  potassium chloride SA (K-DUR,KLOR-CON) 20 MEQ tablet  Daily     05/12/17 1226    report given to miranda at [facility].  Tommy Jensen 05/12/2017 3:54 PM
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
## 2017-05-22 ENCOUNTER — Other Ambulatory Visit: Payer: Self-pay | Admitting: *Deleted

## 2017-05-22 NOTE — Patient Outreach (Signed)
[facility] Care Management  05/22/2017  Tommy Jensen 03-Jan-1941 [id number]   Communication with Wilkie AyeKristy, she reports they are working on sending patient to a rehab unit for ETOH abuse. She is requesting any resources that we may be aware of for this type of rehab.  Outreach to collaborate with [facility].  List of facilities [facility] to [facility] by [facility] staff List of these facilities [facility]   Plan  RNCM will follow up at facility next week for any further care coordination needs.  Alben SpittleMary E. Albertha GheeNiemczura, RN, BSN, CCM  Post Acute Chartered loss adjusterCare Coordinator [facility] [phone]) Business Cell  [phone] Toll Free Office

## 2017-06-08 ENCOUNTER — Other Ambulatory Visit: Payer: Self-pay | Admitting: Internal Medicine

## 2017-06-08 DIAGNOSIS — R569 Unspecified convulsions: Secondary | ICD-10-CM | POA: Diagnosis not present

## 2017-06-08 DIAGNOSIS — M545 Low back pain: Secondary | ICD-10-CM | POA: Diagnosis not present

## 2017-06-08 DIAGNOSIS — S32020A Wedge compression fracture of second lumbar vertebra, initial encounter for closed fracture: Secondary | ICD-10-CM | POA: Diagnosis not present

## 2017-06-08 DIAGNOSIS — Z23 Encounter for immunization: Secondary | ICD-10-CM | POA: Diagnosis not present

## 2017-06-08 DIAGNOSIS — M5441 Lumbago with sciatica, right side: Secondary | ICD-10-CM | POA: Diagnosis not present

## 2017-06-09 ENCOUNTER — Other Ambulatory Visit: Payer: Self-pay | Admitting: Internal Medicine

## 2017-06-09 DIAGNOSIS — S32020A Wedge compression fracture of second lumbar vertebra, initial encounter for closed fracture: Secondary | ICD-10-CM

## 2017-06-10 ENCOUNTER — Other Ambulatory Visit: Payer: Medicare Other

## 2017-06-10 ENCOUNTER — Ambulatory Visit
Admission: RE | Admit: 2017-06-10 | Discharge: 2017-06-10 | Disposition: A | Discharge status: Home / Self Care | Payer: Medicare Other | Source: Ambulatory Visit | Attending: Internal Medicine | Admitting: Internal Medicine

## 2017-06-10 DIAGNOSIS — S32020A Wedge compression fracture of second lumbar vertebra, initial encounter for closed fracture: Secondary | ICD-10-CM

## 2017-06-12 DIAGNOSIS — H401113 Primary open-angle glaucoma, right eye, severe stage: Secondary | ICD-10-CM | POA: Diagnosis not present

## 2017-06-12 DIAGNOSIS — H401121 Primary open-angle glaucoma, left eye, mild stage: Secondary | ICD-10-CM | POA: Diagnosis not present

## 2017-06-12 DIAGNOSIS — Z961 Presence of intraocular lens: Secondary | ICD-10-CM | POA: Diagnosis not present

## 2017-06-17 DIAGNOSIS — S32021D Stable burst fracture of second lumbar vertebra, subsequent encounter for fracture with routine healing: Secondary | ICD-10-CM | POA: Diagnosis not present

## 2017-06-23 DIAGNOSIS — R569 Unspecified convulsions: Secondary | ICD-10-CM | POA: Diagnosis not present

## 2017-06-23 DIAGNOSIS — M5441 Lumbago with sciatica, right side: Secondary | ICD-10-CM | POA: Diagnosis not present

## 2017-06-23 DIAGNOSIS — S32020A Wedge compression fracture of second lumbar vertebra, initial encounter for closed fracture: Secondary | ICD-10-CM | POA: Diagnosis not present

## 2017-06-29 ENCOUNTER — Encounter (HOSPITAL_COMMUNITY): Payer: Self-pay | Admitting: Emergency Medicine

## 2017-06-29 ENCOUNTER — Observation Stay (HOSPITAL_COMMUNITY)
Admission: EM | Admit: 2017-06-29 | Discharge: 2017-07-01 | Disposition: A | Discharge status: Home / Self Care | Payer: Medicare Other | Attending: Internal Medicine | Admitting: Internal Medicine

## 2017-06-29 ENCOUNTER — Emergency Department (HOSPITAL_COMMUNITY): Payer: Medicare Other

## 2017-06-29 DIAGNOSIS — R410 Disorientation, unspecified: Secondary | ICD-10-CM

## 2017-06-29 DIAGNOSIS — F1011 Alcohol abuse, in remission: Secondary | ICD-10-CM | POA: Insufficient documentation

## 2017-06-29 DIAGNOSIS — E876 Hypokalemia: Secondary | ICD-10-CM | POA: Diagnosis not present

## 2017-06-29 DIAGNOSIS — R55 Syncope and collapse: Principal | ICD-10-CM | POA: Insufficient documentation

## 2017-06-29 DIAGNOSIS — I119 Hypertensive heart disease without heart failure: Secondary | ICD-10-CM | POA: Diagnosis not present

## 2017-06-29 DIAGNOSIS — Z79899 Other long term (current) drug therapy: Secondary | ICD-10-CM | POA: Diagnosis not present

## 2017-06-29 DIAGNOSIS — S199XXA Unspecified injury of neck, initial encounter: Secondary | ICD-10-CM | POA: Diagnosis not present

## 2017-06-29 DIAGNOSIS — L8990 Pressure ulcer of unspecified site, unspecified stage: Secondary | ICD-10-CM | POA: Insufficient documentation

## 2017-06-29 DIAGNOSIS — I6523 Occlusion and stenosis of bilateral carotid arteries: Secondary | ICD-10-CM | POA: Diagnosis not present

## 2017-06-29 DIAGNOSIS — Y92096 Garden or yard of other non-institutional residence as the place of occurrence of the external cause: Secondary | ICD-10-CM | POA: Diagnosis not present

## 2017-06-29 DIAGNOSIS — I5189 Other ill-defined heart diseases: Secondary | ICD-10-CM

## 2017-06-29 DIAGNOSIS — I10 Essential (primary) hypertension: Secondary | ICD-10-CM | POA: Diagnosis present

## 2017-06-29 DIAGNOSIS — S064X0A Epidural hemorrhage without loss of consciousness, initial encounter: Secondary | ICD-10-CM | POA: Diagnosis not present

## 2017-06-29 DIAGNOSIS — S0093XA Contusion of unspecified part of head, initial encounter: Secondary | ICD-10-CM | POA: Diagnosis not present

## 2017-06-29 DIAGNOSIS — S0990XA Unspecified injury of head, initial encounter: Secondary | ICD-10-CM | POA: Diagnosis not present

## 2017-06-29 DIAGNOSIS — W19XXXA Unspecified fall, initial encounter: Secondary | ICD-10-CM | POA: Diagnosis not present

## 2017-06-29 DIAGNOSIS — D649 Anemia, unspecified: Secondary | ICD-10-CM | POA: Diagnosis not present

## 2017-06-29 DIAGNOSIS — R4020 Unspecified coma: Secondary | ICD-10-CM | POA: Diagnosis not present

## 2017-06-29 DIAGNOSIS — G9340 Encephalopathy, unspecified: Secondary | ICD-10-CM | POA: Insufficient documentation

## 2017-06-29 HISTORY — DX: Alcohol abuse, uncomplicated: F10.10

## 2017-06-29 HISTORY — DX: Low back pain: M54.5

## 2017-06-29 HISTORY — DX: Gastro-esophageal reflux disease without esophagitis: K21.9

## 2017-06-29 HISTORY — DX: Depression, unspecified: F32.A

## 2017-06-29 HISTORY — DX: Major depressive disorder, single episode, unspecified: F32.9

## 2017-06-29 HISTORY — DX: Anxiety disorder, unspecified: F41.9

## 2017-06-29 HISTORY — DX: Unspecified convulsions: R56.9

## 2017-06-29 HISTORY — DX: Unspecified glaucoma: H40.9

## 2017-06-29 HISTORY — DX: Other chronic pain: G89.29

## 2017-06-29 HISTORY — DX: Pure hypercholesterolemia, unspecified: E78.00

## 2017-06-29 LAB — COMPREHENSIVE METABOLIC PANEL WITH GFR
ALT: 17 U/L (ref 17–63)
AST: 37 U/L (ref 15–41)
Albumin: 3.6 g/dL (ref 3.5–5.0)
Alkaline Phosphatase: 72 U/L (ref 38–126)
Anion gap: 9 (ref 5–15)
BUN: 5 mg/dL — ABNORMAL LOW (ref 6–20)
CO2: 23 mmol/L (ref 22–32)
Calcium: 9.3 mg/dL (ref 8.9–10.3)
Chloride: 104 mmol/L (ref 101–111)
Creatinine, Ser: 1.03 mg/dL (ref 0.61–1.24)
GFR calc Af Amer: >60 mL/min
GFR calc non Af Amer: >60 mL/min
Glucose, Bld: 93 mg/dL (ref 65–99)
Potassium: 3.8 mmol/L (ref 3.5–5.1)
Sodium: 136 mmol/L (ref 135–145)
Total Bilirubin: 0.8 mg/dL (ref 0.3–1.2)
Total Protein: 7.1 g/dL (ref 6.5–8.1)

## 2017-06-29 LAB — CBC WITH DIFFERENTIAL/PLATELET
Basophils Absolute: 0 10*3/uL (ref 0.0–0.1)
Basophils Relative: 0 %
Eosinophils Absolute: 0.2 10*3/uL (ref 0.0–0.7)
Eosinophils Relative: 2 %
HCT: 36.6 % — ABNORMAL LOW (ref 39.0–52.0)
Hemoglobin: 12.5 g/dL — ABNORMAL LOW (ref 13.0–17.0)
Lymphocytes Relative: 25 %
Lymphs Abs: 2.3 10*3/uL (ref 0.7–4.0)
MCH: 32.4 pg (ref 26.0–34.0)
MCHC: 34.2 g/dL (ref 30.0–36.0)
MCV: 94.8 fL (ref 78.0–100.0)
Monocytes Absolute: 0.8 10*3/uL (ref 0.1–1.0)
Monocytes Relative: 9 %
Neutro Abs: 5.8 10*3/uL (ref 1.7–7.7)
Neutrophils Relative %: 64 %
Platelets: 200 10*3/uL (ref 150–400)
RBC: 3.86 MIL/uL — ABNORMAL LOW (ref 4.22–5.81)
RDW: 12 % (ref 11.5–15.5)
WBC: 9.1 10*3/uL (ref 4.0–10.5)

## 2017-06-29 LAB — AMMONIA: Ammonia: 19 umol/L (ref 9–35)

## 2017-06-29 LAB — PROTIME-INR
INR: 1.02
Prothrombin Time: 13.3 s (ref 11.4–15.2)

## 2017-06-29 LAB — ETHANOL: Alcohol, Ethyl (B): <10 mg/dL (ref <10)

## 2017-06-29 LAB — TROPONIN I

## 2017-06-29 LAB — APTT: aPTT: 35 s (ref 24–36)

## 2017-06-29 LAB — CBG MONITORING, ED
Glucose-Capillary: 90 mg/dL (ref 65–99)
Glucose-Capillary: 76 mg/dL (ref 65–99)

## 2017-06-29 MED ORDER — LORAZEPAM 2 MG/ML IJ SOLN
1.0000 mg | Freq: Once | INTRAMUSCULAR | Status: AC
  Administered 2017-06-29: 1 mg via INTRAVENOUS
  Filled 2017-06-29: qty 1

## 2017-06-29 MED ORDER — ACETAMINOPHEN 325 MG PO TABS: 650.0000 mg | ORAL_TABLET | Freq: Four times a day (QID) | ORAL | Status: DC | PRN

## 2017-06-29 MED ORDER — SODIUM CHLORIDE 0.9 % IV BOLUS (SEPSIS)
1000.0000 mL | Freq: Once | INTRAVENOUS | Status: AC
  Administered 2017-06-29: 1000 mL via INTRAVENOUS

## 2017-06-29 MED ORDER — ENOXAPARIN SODIUM 40 MG/0.4ML ~~LOC~~ SOLN
40.0000 mg | Freq: Every day | SUBCUTANEOUS | Status: DC
  Administered 2017-06-30 – 2017-07-01 (×2): 40 mg via SUBCUTANEOUS
  Filled 2017-06-29 (×2): qty 0.4

## 2017-06-29 MED ORDER — HALOPERIDOL LACTATE 5 MG/ML IJ SOLN
5.0000 mg | Freq: Once | INTRAMUSCULAR | Status: AC
  Administered 2017-06-29: 5 mg via INTRAMUSCULAR
  Filled 2017-06-29: qty 1

## 2017-06-29 MED ORDER — ACETAMINOPHEN 650 MG RE SUPP: 650.0000 mg | Freq: Four times a day (QID) | RECTAL | Status: DC | PRN

## 2017-06-29 MED ORDER — DORZOLAMIDE HCL-TIMOLOL MAL 2-0.5 % OP SOLN
1.0000 [drp] | Freq: Two times a day (BID) | OPHTHALMIC | Status: DC
  Administered 2017-06-30 – 2017-07-01 (×4): 1 [drp] via OPHTHALMIC
  Filled 2017-06-29: qty 10

## 2017-06-29 MED ORDER — VITAMIN B-1 100 MG PO TABS
100.0000 mg | ORAL_TABLET | Freq: Every day | ORAL | Status: DC
  Administered 2017-06-30 – 2017-07-01 (×3): 100 mg via ORAL
  Filled 2017-06-29 (×3): qty 1

## 2017-06-29 MED ORDER — LORAZEPAM 2 MG/ML IJ SOLN: 1.0000 mg | Freq: Once | INTRAMUSCULAR | Status: DC

## 2017-06-29 MED ORDER — FOLIC ACID 1 MG PO TABS
1.0000 mg | ORAL_TABLET | Freq: Every day | ORAL | Status: DC
  Administered 2017-06-30 – 2017-07-01 (×3): 1 mg via ORAL
  Filled 2017-06-29 (×3): qty 1

## 2017-06-29 MED ORDER — DEXTROSE 50 % IV SOLN
25.0000 mL | Freq: Once | INTRAVENOUS | Status: AC
  Administered 2017-06-29: 25 mL via INTRAVENOUS

## 2017-06-29 MED ORDER — THIAMINE HCL 100 MG/ML IJ SOLN: 100.0000 mg | Freq: Every day | INTRAMUSCULAR | Status: DC

## 2017-06-29 MED ORDER — LORAZEPAM 2 MG/ML IJ SOLN
1.0000 mg | Freq: Once | INTRAMUSCULAR | Status: AC
  Administered 2017-06-29: 1 mg via INTRAVENOUS

## 2017-06-29 MED ORDER — DEXTROSE 50 % IV SOLN
INTRAVENOUS | Status: AC
  Filled 2017-06-29: qty 50

## 2017-06-29 MED ORDER — ADULT MULTIVITAMIN W/MINERALS CH
1.0000 | ORAL_TABLET | Freq: Every day | ORAL | Status: DC
  Administered 2017-06-30 – 2017-07-01 (×3): 1 via ORAL
  Filled 2017-06-29 (×3): qty 1

## 2017-06-29 MED ORDER — LORAZEPAM 2 MG/ML IJ SOLN
INTRAMUSCULAR | Status: AC
  Filled 2017-06-29: qty 1

## 2017-06-29 NOTE — ED Provider Notes (Signed)
[facility] EMERGENCY DEPARTMENT Provider Note   CSN: [id number] Arrival date & time: 06/29/17  1341     History   Chief Complaint Chief Complaint  Patient presents with  . Altered Mental Status  . Head Injury    HPI Tommy Jensen is a 76 y.o. male.  Patient is a 76 year old male with a history of alcohol abuse no other known history who presents with altered mental status. Reportedly he was found by a neighbor outside of his home lying on the ground. He was noted to have a hematoma to his head. His other mental status is been altered. He was combative in route and was given Haldol and Versed by EMS. Per report, he was given 5 mg of Haldol and 2.5 mg by EMS. No other history can be obtained given his altered mental status      Past Medical History:  Diagnosis Date  . ETOH abuse     There are no active problems to display for this patient.   History reviewed. No pertinent surgical history.     Home Medications    Prior to Admission medications   Not on File    Family History No family history on file.  Social History Social History  Substance Use Topics  . Smoking status: Not on file  . Smokeless tobacco: Not on file  . Alcohol use Yes     Allergies   Patient has no allergy information on record.   Review of Systems Review of Systems  Unable to perform ROS: Mental status change     Physical Exam Updated Vital Signs BP (!) 150/86   Pulse (!) 103   Resp 14   Ht  (1.702 m)   Wt 77.1 kg (170 lb)   SpO2 94%   BMI 26.63 kg/m   Physical Exam  Constitutional: He appears well-developed and well-nourished.  HENT:  Abrasions x 2 to the scalp  Eyes: Pupils are equal, round, and reactive to light.  Neck:  No obvious tenderness to the cervical thoracic or lumbosacral spine  Cardiovascular: Normal rate, regular rhythm and normal heart sounds.   Pulmonary/Chest: Effort normal and breath sounds normal. No respiratory distress.  He has no wheezes. He has no rales. He exhibits no tenderness.  Abdominal: Soft. Bowel sounds are normal. There is no tenderness. There is no rebound and no guarding.  Musculoskeletal: Normal range of motion. He exhibits no edema.  o deformity or obvious discomfort on palpation or range of motion extremities  Lymphadenopathy:    He has no cervical adenopathy.  Neurological: He is alert.  Patient is alert with eyes open but is very agitated and nonverbal. He will follow simple commands. He is moving all extremities symmetrically  Skin: Skin is warm and dry. No rash noted.  Psychiatric: He has a normal mood and affect.     ED Treatments / Results  Labs (all labs ordered are listed, but only abnormal results are displayed) Labs Reviewed  CBC WITH DIFFERENTIAL/PLATELET - Abnormal; Notable for the following:       Result Value   RBC 3.86 (*)    Hemoglobin 12.5 (*)    HCT 36.6 (*)    All other components within normal limits  COMPREHENSIVE METABOLIC PANEL - Abnormal; Notable for the following:    BUN 5 (*)    All other components within normal limits  ETHANOL  URINALYSIS, ROUTINE W REFLEX MICROSCOPIC  RAPID URINE DRUG SCREEN, HOSP PERFORMED  AMMONIA  PROTIME-INR  APTT  CBG MONITORING, ED  CBG MONITORING, ED    EKG  EKG Interpretation  Date/Time:  Monday June 29 2017 14:16:07 EDT Ventricular Rate:  105 PR Interval:    QRS Duration: 89 QT Interval:  370 QTC Calculation: 489 R Axis:   61 Text Interpretation:  Sinus tachycardia Consider right atrial enlargement Abnormal T, consider ischemia, anterior leads Baseline wander in lead(s) V6 No old tracing to compare Confirmed by Rolan Bucco [phone]) on 06/29/2017 3:18:18 PM       Radiology Ct Head Wo Contrast  Result Date: 06/29/2017 CLINICAL DATA:  Fall, altered level of consciousness, high clinical risk of cervical spine trauma EXAM: CT HEAD WITHOUT CONTRAST CT CERVICAL SPINE WITHOUT CONTRAST TECHNIQUE: Multidetector CT  imaging of the head and cervical spine was performed following the standard protocol without intravenous contrast. Multiplanar CT image reconstructions of the cervical spine were also generated. COMPARISON:  None FINDINGS: CT HEAD FINDINGS Brain: Streak artifacts at skull base and posterior fossa. Generalized atrophy. Normal ventricular morphology. No midline shift or mass effect. Small vessel chronic ischemic changes of deep cerebral white matter. Small age-indeterminate lacunar infarct at RIGHT thalamus. Old RIGHT basal ganglia lacunar infarct. No intracranial hemorrhage, mass lesion, or evidence of acute cortical infarction. No extra-axial fluid collections. Vascular: Atherosclerotic calcification of internal carotid arteries bilaterally at skullbase Skull: Asymmetric positioning within the scanner. Calvaria grossly intact Sinuses/Orbits: Visualized paranasal sinuses and mastoid air cells clear Other: N/A CT CERVICAL SPINE FINDINGS Alignment: Minimal retrolistheses at C3-C4, C4-C5 and C5-C6 likely degenerative. Remaining alignments normal. Skull base and vertebrae: Diffuse osseous demineralization. Asymmetric positioning in gantry. Visualized skullbase intact. Vertebral body heights maintained without fracture or bone destruction. Mild scattered facet degenerative changes. Minimal encroachment upon RIGHT cervical neural foramina at C5-C6 and C6-C7, less C4-C5 by uncovertebral spurs. Soft tissues and spinal canal: Prevertebral soft tissues normal thickness Disc levels: Multilevel disc space narrowing and few scattered endplate spurs. Spinal canal grossly patent. Upper chest: Lung apices clear Other: N/A IMPRESSION: Atrophy with small vessel chronic ischemic changes of deep cerebral white matter. 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 No other intracranial abnormalities. Mild degenerative disc and facet disease changes of the cervical spine as above. No acute cervical spine  abnormalities. Electronically Signed   By: Ulyses Southward M.D.   On: 06/29/2017 16:17   Ct Cervical Spine Wo Contrast  Result Date: 06/29/2017 CLINICAL DATA:  Fall, altered level of consciousness, high clinical risk of cervical spine trauma EXAM: CT HEAD WITHOUT CONTRAST CT CERVICAL SPINE WITHOUT CONTRAST TECHNIQUE: Multidetector CT imaging of the head and cervical spine was performed following the standard protocol without intravenous contrast. Multiplanar CT image reconstructions of the cervical spine were also generated. COMPARISON:  None FINDINGS: CT HEAD FINDINGS Brain: Streak artifacts at skull base and posterior fossa. Generalized atrophy. Normal ventricular morphology. No midline shift or mass effect. Small vessel chronic ischemic changes of deep cerebral white matter. Small age-indeterminate lacunar infarct at RIGHT thalamus. Old RIGHT basal ganglia lacunar infarct. No intracranial hemorrhage, mass lesion, or evidence of acute cortical infarction. No extra-axial fluid collections. Vascular: Atherosclerotic calcification of internal carotid arteries bilaterally at skullbase Skull: Asymmetric positioning within the scanner. Calvaria grossly intact Sinuses/Orbits: Visualized paranasal sinuses and mastoid air cells clear Other: N/A CT CERVICAL SPINE FINDINGS Alignment: Minimal retrolistheses at C3-C4, C4-C5 and C5-C6 likely degenerative. Remaining alignments normal. Skull base and vertebrae: Diffuse osseous demineralization. Asymmetric positioning in gantry. Visualized skullbase intact. Vertebral body heights maintained  without fracture or bone destruction. Mild scattered facet degenerative changes. Minimal encroachment upon RIGHT cervical neural foramina at C5-C6 and C6-C7, less C4-C5 by uncovertebral spurs. Soft tissues and spinal canal: Prevertebral soft tissues normal thickness Disc levels: Multilevel disc space narrowing and few scattered endplate spurs. Spinal canal grossly patent. Upper chest: Lung  apices clear Other: N/A IMPRESSION: Atrophy with small vessel chronic ischemic changes of deep cerebral white matter. 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 No other intracranial abnormalities. Mild degenerative disc and facet disease changes of the cervical spine as above. No acute cervical spine abnormalities. Electronically Signed   By: Ulyses Southward M.D.   On: 06/29/2017 16:17    Procedures Procedures (including critical care time)  Medications Ordered in ED Medications  LORazepam (ATIVAN) injection 1 mg (not administered)  sodium chloride 0.9 % bolus 1,000 mL (not administered)  haloperidol lactate (HALDOL) injection 5 mg (5 mg Intramuscular Given 06/29/17 1432)  LORazepam (ATIVAN) injection 1 mg (1 mg Intravenous Given 06/29/17 1502)  LORazepam (ATIVAN) injection 1 mg (1 mg Intravenous Given 06/29/17 1546)  dextrose 50 % solution 25 mL (25 mLs Intravenous Given 06/29/17 1543)     Initial Impression / Assessment and Plan / ED Course  I have reviewed the triage vital signs and the nursing notes.  Pertinent labs & imaging results that were available during my care of the patient were reviewed by me and considered in my medical decision making (see chart for details).     Patient's a 76 year old male with a history of alcohol abuse who presents with delirium.  His alcohol level is negative,he is a little bit tremulous and mildly tachycardic, this may be DTs. He is required sedation with Haldol and Ativan. His head CT is negative for acute injuries.  He is afebrile. His labs are non-concerning.  I spoke the IM teaching service resident who will admit the pt.  Final Clinical Impressions(s) / ED Diagnoses   Final diagnoses:  Delirium    New Prescriptions New Prescriptions   No medications on file     Rolan Bucco, MD 06/29/17 1640

## 2017-06-29 NOTE — ED Triage Notes (Signed)
Pt in from [facility] after being found down on the ground [place] neighborhood. Per EMS, pt was lying supine and combative when they arrived on scene. Hx of seizures and possible ETOH abuse. Given 5 of Versed and 2.5 Haldol en route. GCS of 11 on ED arrival, oriented only to name. sats 94% RA

## 2017-06-29 NOTE — ED Triage Notes (Signed)
Pt in via [facility] after being found [place] his neighborhood, supine and combative with a posterior head hematoma. EMS gave  Versed and 2.5mg  Haldol en route. Arrives with GCS of 11, oriented only to self. Sats 94% on RA.
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
## 2017-06-30 ENCOUNTER — Observation Stay (HOSPITAL_BASED_OUTPATIENT_CLINIC_OR_DEPARTMENT_OTHER): Payer: Medicare Other

## 2017-06-30 ENCOUNTER — Observation Stay (HOSPITAL_COMMUNITY): Payer: Medicare Other

## 2017-06-30 ENCOUNTER — Encounter (HOSPITAL_COMMUNITY): Payer: Self-pay

## 2017-06-30 DIAGNOSIS — R55 Syncope and collapse: Secondary | ICD-10-CM | POA: Diagnosis not present

## 2017-06-30 DIAGNOSIS — E876 Hypokalemia: Secondary | ICD-10-CM | POA: Diagnosis not present

## 2017-06-30 DIAGNOSIS — I6523 Occlusion and stenosis of bilateral carotid arteries: Secondary | ICD-10-CM | POA: Diagnosis present

## 2017-06-30 DIAGNOSIS — I5030 Unspecified diastolic (congestive) heart failure: Secondary | ICD-10-CM | POA: Diagnosis not present

## 2017-06-30 DIAGNOSIS — I10 Essential (primary) hypertension: Secondary | ICD-10-CM | POA: Diagnosis not present

## 2017-06-30 DIAGNOSIS — S0003XA Contusion of scalp, initial encounter: Secondary | ICD-10-CM | POA: Diagnosis not present

## 2017-06-30 DIAGNOSIS — G9340 Encephalopathy, unspecified: Secondary | ICD-10-CM

## 2017-06-30 DIAGNOSIS — I119 Hypertensive heart disease without heart failure: Secondary | ICD-10-CM | POA: Diagnosis not present

## 2017-06-30 LAB — URINALYSIS, ROUTINE W REFLEX MICROSCOPIC
BILIRUBIN URINE: NEGATIVE
Glucose, UA: NEGATIVE mg/dL
Hgb urine dipstick: NEGATIVE
KETONES UR: 5 mg/dL — AB
LEUKOCYTES UA: NEGATIVE
NITRITE: NEGATIVE
Protein, ur: NEGATIVE mg/dL
SPECIFIC GRAVITY, URINE: 1.013 (ref 1.005–1.030)
pH: 5 (ref 5.0–8.0)

## 2017-06-30 LAB — ECHOCARDIOGRAM COMPLETE
Height: 67 in
Weight: 2720 oz

## 2017-06-30 LAB — LIPID PANEL
CHOL/HDL RATIO: 4.9 ratio
Cholesterol: 168 mg/dL (ref 0–200)
HDL: 34 mg/dL — AB (ref >40)
LDL CALC: 118 mg/dL — AB (ref 0–99)
TRIGLYCERIDES: 80 mg/dL (ref <150)
VLDL: 16 mg/dL (ref 0–40)

## 2017-06-30 LAB — GLUCOSE, CAPILLARY
GLUCOSE-CAPILLARY: 84 mg/dL (ref 65–99)
GLUCOSE-CAPILLARY: 101 mg/dL — AB (ref 65–99)
Glucose-Capillary: 118 mg/dL — ABNORMAL HIGH (ref 65–99)
Glucose-Capillary: 125 mg/dL — ABNORMAL HIGH (ref 65–99)
Glucose-Capillary: 83 mg/dL (ref 65–99)
Glucose-Capillary: 84 mg/dL (ref 65–99)

## 2017-06-30 LAB — RAPID URINE DRUG SCREEN, HOSP PERFORMED
Amphetamines: NOT DETECTED
BENZODIAZEPINES: POSITIVE — AB
Barbiturates: NOT DETECTED
COCAINE: NOT DETECTED
OPIATES: NOT DETECTED
Tetrahydrocannabinol: NOT DETECTED

## 2017-06-30 LAB — BASIC METABOLIC PANEL
Anion gap: 9 (ref 5–15)
CHLORIDE: 108 mmol/L (ref 101–111)
CO2: 21 mmol/L — AB (ref 22–32)
Calcium: 8.9 mg/dL (ref 8.9–10.3)
Creatinine, Ser: 0.79 mg/dL (ref 0.61–1.24)
GFR calc Af Amer: >60 mL/min (ref >60)
Glucose, Bld: 83 mg/dL (ref 65–99)
Potassium: 3.3 mmol/L — ABNORMAL LOW (ref 3.5–5.1)
Sodium: 138 mmol/L (ref 135–145)

## 2017-06-30 LAB — CBC
HEMATOCRIT: 34.6 % — AB (ref 39.0–52.0)
HEMOGLOBIN: 11.8 g/dL — AB (ref 13.0–17.0)
MCH: 32.4 pg (ref 26.0–34.0)
MCHC: 34.1 g/dL (ref 30.0–36.0)
MCV: 95.1 fL (ref 78.0–100.0)
Platelets: 180 10*3/uL (ref 150–400)
RBC: 3.64 MIL/uL — AB (ref 4.22–5.81)
RDW: 12.1 % (ref 11.5–15.5)
WBC: 8.5 10*3/uL (ref 4.0–10.5)

## 2017-06-30 LAB — HEMOGLOBIN A1C
HEMOGLOBIN A1C: 4.8 % (ref 4.8–5.6)
Mean Plasma Glucose: 91.06 mg/dL

## 2017-06-30 LAB — TROPONIN I: Troponin I: <0.03 ng/mL (ref <0.03)

## 2017-06-30 MED ORDER — GADOBENATE DIMEGLUMINE 529 MG/ML IV SOLN
15.0000 mL | Freq: Once | INTRAVENOUS | Status: AC | PRN
  Administered 2017-06-30: 15 mL via INTRAVENOUS

## 2017-06-30 MED ORDER — LOSARTAN POTASSIUM 50 MG PO TABS
50.0000 mg | ORAL_TABLET | Freq: Every day | ORAL | Status: DC
  Administered 2017-06-30 – 2017-07-01 (×2): 50 mg via ORAL
  Filled 2017-06-30 (×2): qty 1

## 2017-06-30 MED ORDER — ASPIRIN EC 81 MG PO TBEC
81.0000 mg | DELAYED_RELEASE_TABLET | Freq: Every day | ORAL | Status: DC
  Administered 2017-06-30 – 2017-07-01 (×2): 81 mg via ORAL
  Filled 2017-06-30 (×2): qty 1

## 2017-06-30 MED ORDER — SODIUM CHLORIDE 0.9 % IV SOLN
INTRAVENOUS | Status: DC
  Administered 2017-06-30: 07:00:00 via INTRAVENOUS

## 2017-06-30 MED ORDER — POTASSIUM CHLORIDE CRYS ER 20 MEQ PO TBCR
40.0000 meq | EXTENDED_RELEASE_TABLET | Freq: Once | ORAL | Status: AC
  Administered 2017-06-30: 40 meq via ORAL
  Filled 2017-06-30: qty 2

## 2017-06-30 NOTE — Progress Notes (Signed)
  Echocardiogram 2D Echocardiogram has been performed.  Tommy Jensen 06/30/2017, 12:04 PM

## 2017-06-30 NOTE — Progress Notes (Addendum)
   Subjective: Patient seen and examined. He was accompanied by his daughter this morning. The patient is alert and oriented to person, place, and time. No longer agitated or lethargic. The patient has no recollection of the events that occurred yesterday and states the last thing he remembers is being inside his house. He does not recall walking to his mailbox where he was subsequently found by a neighbor. Per the patient's daughter the neighbor reported that the patient was agitated and non-verba;, but conscious. He denies recent alcohol use or recent changes in his medications. He denies recent episodes of focal weakness, slurred speech, difficulty swallowing. Denies chest pain, shortness of breath, or recent infections. Has no complaints this morning and was requesting breakfast.   Objective:  Vital signs in last 24 hours: Vitals:   06/29/17 2200 06/29/17 2258 06/29/17 2336 06/30/17 0449  BP: 104/62 (!) 149/88 (!) 142/74 136/80  Pulse: 80 91 88 70  Resp: Temp:   98.6 F (37 C) 98.5 F (36.9 C)  TempSrc:   Oral Oral  SpO2: 94% 97% 98% 100%  Weight:      Height:       General: Laying in bed comfortably, NAD HEENT: [place]/AT, EOMI, no scleral icterus, PERRL Cardiac: RRR, No R/M/G appreciated Pulm: normal effort, CTAB Abd: scaphoid, soft, non tender, non distended, BS normal Ext: extremities well perfused, no peripheral edema Neuro: alert and oriented X3, cranial nerves II-XII grossly intact, no focal neurological deficits appreciated on exam, strength 5/5 bilaterally in upper and lower extremities   Assessment/Plan:  Principal Problem:   Acute encephalopathy Active Problems:   Essential hypertension   Carotid atherosclerosis, bilateral   Hypokalemia Acute Encephalopathy Unclear etiology at this time. Patient with no focal neurological deficits on exam and has returned to baseline mental status. Acute MI r/o with non-ischemic EKG and negative troponin trend. No  metabolic derangements noted on initial and repeat labs, no signs/symptoms of acute infection pending UA. Patient with new new age-indeterminate infarct on CT scan of the head concerning for CVA. Seizure is also a possibility, but no recurrent seizure activity thus far.  -UA, UDS pending  -MRI brain W WO contrast, f/u results -ECHO today, Carotid dopplers -Aspirin 81 mg daily -PT/OT  Hypokalemia  Serum potassuim 3.3 this AM.  -Replaced with 40 meq K-dur -BMET in AM   Hypertension BP currently stable at 136/80, with intermittent elevated Bps recorded overnight. Patient's home regimen includes Losartan 50 mg. Patient confirmed he is taking this at home. -Restart Losartan 50 mg -Continue to monitor  Alcohol Use Disorder Patient denies drinking since discharge from the hospital in August 2018.  -CIWA protocol w/o ativan   F: NS 75 cc/hr  E: Replace as needed N: Regular diet  VTE ppx: Lovenox   Dispo: Anticipated discharge in approximately 1-2 day(s).   Toney Rakes, MD 06/30/2017, 11:54 AM Pager: [phone]

## 2017-06-30 NOTE — Care Management Obs Status (Signed)
MEDICARE OBSERVATION STATUS NOTIFICATION   Patient Details  Name: Tommy Jensen MRN: [id number] Date of Birth: 23-Nov-1940   Medicare Observation Status Notification Given:  Yes    Lawerance Sabal, RN 06/30/2017, 11:33 AM

## 2017-07-01 ENCOUNTER — Observation Stay (HOSPITAL_BASED_OUTPATIENT_CLINIC_OR_DEPARTMENT_OTHER): Payer: Medicare Other

## 2017-07-01 DIAGNOSIS — I6523 Occlusion and stenosis of bilateral carotid arteries: Secondary | ICD-10-CM | POA: Diagnosis present

## 2017-07-01 DIAGNOSIS — R55 Syncope and collapse: Secondary | ICD-10-CM

## 2017-07-01 DIAGNOSIS — R410 Disorientation, unspecified: Secondary | ICD-10-CM | POA: Diagnosis not present

## 2017-07-01 DIAGNOSIS — I10 Essential (primary) hypertension: Secondary | ICD-10-CM | POA: Diagnosis not present

## 2017-07-01 DIAGNOSIS — G9340 Encephalopathy, unspecified: Secondary | ICD-10-CM | POA: Diagnosis not present

## 2017-07-01 DIAGNOSIS — L8990 Pressure ulcer of unspecified site, unspecified stage: Secondary | ICD-10-CM | POA: Insufficient documentation

## 2017-07-01 DIAGNOSIS — I5189 Other ill-defined heart diseases: Secondary | ICD-10-CM

## 2017-07-01 LAB — CBC
HCT: 35 % — ABNORMAL LOW (ref 39.0–52.0)
Hemoglobin: 11.6 g/dL — ABNORMAL LOW (ref 13.0–17.0)
MCH: 31.6 pg (ref 26.0–34.0)
MCHC: 33.1 g/dL (ref 30.0–36.0)
MCV: 95.4 fL (ref 78.0–100.0)
PLATELETS: 194 10*3/uL (ref 150–400)
RBC: 3.67 MIL/uL — AB (ref 4.22–5.81)
RDW: 12.4 % (ref 11.5–15.5)
WBC: 7.8 10*3/uL (ref 4.0–10.5)

## 2017-07-01 LAB — BASIC METABOLIC PANEL
Anion gap: 7 (ref 5–15)
BUN: <5 mg/dL — ABNORMAL LOW (ref 6–20)
CALCIUM: 9.2 mg/dL (ref 8.9–10.3)
CO2: 22 mmol/L (ref 22–32)
CREATININE: 0.68 mg/dL (ref 0.61–1.24)
Chloride: 109 mmol/L (ref 101–111)
GFR calc non Af Amer: >60 mL/min (ref >60)
Glucose, Bld: 96 mg/dL (ref 65–99)
Potassium: 3.6 mmol/L (ref 3.5–5.1)
Sodium: 138 mmol/L (ref 135–145)

## 2017-07-01 LAB — GLUCOSE, CAPILLARY
GLUCOSE-CAPILLARY: 117 mg/dL — AB (ref 65–99)
Glucose-Capillary: 85 mg/dL (ref 65–99)

## 2017-07-01 MED ORDER — ASPIRIN 81 MG PO TBEC: 81.0000 mg | DELAYED_RELEASE_TABLET | Freq: Every day | ORAL | 0 refills | Status: AC

## 2017-07-01 NOTE — Progress Notes (Addendum)
   Subjective: Patient seen and examined. No acute events overnights. His only complaint this morning is fatigue and being unable to sleep. He denies chest pain, palpitations, or shortness of breath. The results of his MRI and ECHO were discussed with him. Discussed with the patient that muscle relaxants can have sedating side effects, and encouraged him to take his Tizanidine at bedtime rather in the morning.   Objective:  Vital signs in last 24 hours: Vitals:   06/30/17 0449 06/30/17 1411 06/30/17 2151 07/01/17 0508  BP: 136/80 122/77 128/65 122/67  Pulse: 70 98 87 76  Resp: 18 20 17 17   Temp: 98.5 F (36.9 C) 97.9 F (36.6 C)    TempSrc: Oral Oral    SpO2: 100% 100% 96% 99%  Weight:      Height:       General: Sitting comfortably in chair, NAD HEENT: [place]/AT, EOMI, no scleral icterus Cardiac: RRR, No R/M/G appreciated Pulm: normal effort, CTAB Abd: scaphoid, soft, non tender, non distended, BS normal Ext: extremities well perfused, no peripheral edema Neuro: alert and oriented X3, cranial nerves II-XII grossly intact, no focal neurological deficits appreciated on exam, strength 5/5 bilaterally in upper and lower extremities   Assessment/Plan:  Principal Problem:   Acute encephalopathy Active Problems:   Essential hypertension   Carotid atherosclerosis, bilateral   Hypokalemia   Pressure injury of skin Syncopal episode Acute Encephalopathy The cause of the patient's syncopal episode followed by altered mental status remains unclear. MRI of the brain showed no acute intracranial abnormalities, negative for infarction or focuses that may result in seizures. Echo was within normal limits with an EF of 60-65%, without wall motion or valvular abnormalities. No arrhthymias on telemetry. UA was negative for infection. No metabolic derangements on BMET, CBC with stable normocytic anemia Hgb 11.6 which seems to be around the patients baseline.  -Carotid dopplers pending, if not  completed can be done as outpatient  -Aspirin 81 mg daily, will continue at discharge  -PT/OT-->No PT follow up recommended  -D/c later today pending OT assessment   Hypokalemia  Resolved. Serum potassuim 3.6 this AM.   Hypertension BP normotensive 122/67.  -Continue Losartan 50 mg -Continue to monitor  Alcohol Use Disorder Patient denies drinking since discharge from the hospital in August 2018.  -D/Ced CIWA protocol   F: None  E: Replace as needed N: Regular diet  VTE ppx: Lovenox   Dispo: Anticipated discharge in approximately 1-2 day(s).   Toney RakesLacroce, [place] J, MD 07/01/2017, 10:02 AM Pager: [phone]

## 2017-07-01 NOTE — Discharge Summary (Signed)
Name: Tommy Jensen MRN: [id number] DOB: April 30, 1941 76 y.o. PCP: Georgianne Fickamachandran, Ajith, MD  Date of Admission: 06/29/2017  1:41 PM Date of Discharge: 07/01/2017 Attending Physician: Gust RungHoffman, Erik C, DO  Discharge Diagnosis: 1. Syncopal episode, Acute Encephalopathy Principal Problem:   Acute encephalopathy Active Problems:   Essential hypertension   Carotid atherosclerosis, bilateral   Hypokalemia   Pressure injury of skin   Diastolic dysfunction without heart failure   Bilateral carotid artery stenosis   Discharge Medications: Allergies as of 07/01/2017   Not on File     Medication List    TAKE these medications   aspirin 81 MG EC tablet Take 1 tablet (81 mg total) by mouth daily.   celecoxib 200 MG capsule Commonly known as:  CELEBREX Take 200 mg by mouth daily.   DORZOLAMIDE HCL-TIMOLOL MAL OP Place 1 drop into the right eye 2 (two) times daily.   losartan 100 MG tablet Commonly known as:  COZAAR Take 100 mg by mouth daily.   tiZANidine 4 MG tablet Commonly known as:  ZANAFLEX Take 4 mg by mouth every 8 (eight) hours as needed for muscle spasms.       Disposition and follow-up:   Mr.Tommy Jensen was [facility] in Good condition.  At the hospital follow up visit please address:  1. Syncope with collapse follow by acute encephalopathy  -Any recurrent syncopal episodes or seizure like activity -Is the patient taking aspirin 81 mg daily that was prescribed at discharge -When is the patient taking muscle relaxant Tizanidine? Suggested taking at night prior to bed time -Reconsider need for Tizanidine  -Carotid doppler results  2.  Labs / imaging needed at time of follow-up: None  3.  Pending labs/ test needing follow-up:   -CAROTID DOPPLERS COMPLETED, PRELIMINARY READ ONLY UPON DISCHARGE;  PLEASE FOLLOW UP FINAL READ   Follow-up Appointments: Follow-up Information    Georgianne Fickamachandran, Ajith, MD. Schedule an appointment as  soon as possible for a visit in 1 week(s).   Specialty:  Internal Medicine Contact information: [address] [place] [address] [phone]           Hospital Course by problem list: Principal Problem:   Acute encephalopathy Active Problems:   Essential hypertension   Carotid atherosclerosis, bilateral   Hypokalemia   Pressure injury of skin   Diastolic dysfunction without heart failure   Bilateral carotid artery stenosis   1. Syncopal episode followed by acute encephalopathy Mr. [name] was [facility] and the Internal Medicine Teaching Service after being found on the ground by a neighbor. He also had altered mental status and agitation at that time. Prior to arrival to the emergency department, he was given Haldol and Versed by EMS due to his agitation. In the emergency department, the patient was hemodynamically stable with BP of 150/86 and tachycardia with HR of 103. He was afebrile, saturating well on room air. He remained lethargic and agitated in the emergency department and was given ativan, as well as versed. EKG was without evidence of ischemic changes, troponin levels were trended and resulted negative x 3. Labs were largely unremarkable except for a mild normocytic anemia with a hemoglobin of 12.5. He had no leukocytosis. He had no electrolyte, liver function, or renal abnormalities. Urine analysis was negative for signs of infection. Blood ETOH level was undetectable. UDS was positive for benzodiazepines, but the patient was given versed and ativan by EMS and ED providers. 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 No other intracranial abnormalities were seen, and there was no acute cervical spine abnormalities.   The morning after admission the patient was calm, as well as alert and oriented. He did not  recall walking to his mailbox or falling, and the last thing he remembered was being inside his home, in his normal state of health. He denied recent alcohol use. An MRI, echocardiogram, and carotid dopplers were also performed. MRI revealed no acute intracranial abnormalities, and no signs of an acute infarction. Echocardiogram was normal, and did not reveal significant valvular or wall motion abnormalities. The results of the carotid dopplers were not finalized at the time of the patient's discharge. The patient was on cardiac monitoring throughout his hospitalization, and no cardiac arrhythmias were captured. His blood pressure remained stable with no signs of hypotension. He was evaluated by physical therapy and deemed stable to be discharged home.   The patient had no evidence of an acute MI, acute CVA, or infection.  It is of note that the patient was recently started on a muscle relaxant and had been taking Tizanidine every morning. He was instructed to take this at night while in bed because of its sedating side effects and to reconsider the need for this medication at follow up visit with his PCP. The patient was also discharged on Aspirin 81 mg daily. 2. Hypokalemia  Serum potassium was low at 3.3 and replaced with appropriate supplementation. Potassium on day of discharge was 3.6.   3. Hypertension  Patient's blood pressure was initially hypertensive, but then normalized.  Losartan 50 mg was resumed during his hospitalization and was continued at discharge.   Discharge Vitals:   BP (!) 145/82 (BP Location: Right Arm)   Pulse 78   Temp 98.4 F (36.9 C) (Oral)   Resp 17   Ht 5\' 7"  (1.702 m)   Wt 170 lb (77.1 kg)   SpO2 99%   BMI 26.63 kg/m   Pertinent Labs, Studies, and Procedures:  CMP Latest Ref Rng & Units 07/01/2017 06/30/2017 06/29/2017  Glucose 65 - 99 mg/dL 96 83 93  BUN 6 - 20 mg/dL <5(A) <2(Z) 5(L)  Creatinine 0.61 - 1.24 mg/dL 3.08 6.57 8.46  Sodium 135 - 145 mmol/L 138  138 136  Potassium 3.5 - 5.1 mmol/L 3.6 3.3(L) 3.8  Chloride 101 - 111 mmol/L 109 108 104  CO2 22 - 32 mmol/L 22 21(L) 23  Calcium 8.9 - 10.3 mg/dL 9.2 8.9 9.3  Total Protein 6.5 - 8.1 g/dL - - 7.1  Total Bilirubin 0.3 - 1.2 mg/dL - - 0.8  Alkaline Phos 38 - 126 U/L - - 72  AST 15 - 41 U/L - - 37  ALT 17 - 63 U/L - - 17   CBC Latest Ref Rng & Units 07/01/2017 06/30/2017 06/29/2017  WBC 4.0 - 10.5 K/uL 7.8 8.5 9.1  Hemoglobin 13.0 - 17.0 g/dL 11.6(L) 11.8(L) 12.5(L)  Hematocrit 39.0 - 52.0 % 35.0(L) 34.6(L) 36.6(L)  Platelets 150 - 400 K/uL 194 180 200   CT HEAD WO CONTRAST, CT CERVICAL SPINE IMPRESSION: Atrophy with small vessel chronic ischemic changes of deep cerebral white matter. 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 No other intracranial abnormalities. Mild degenerative disc and facet disease changes of the cervical spine as above. No acute cervical spine abnormalities.  MRI BRAIN W WO  CONTRAST IMPRESSION: 1. No acute intracranial abnormality. 2. No acute infarct but moderate for age signal changes in the deep gray matter nuclei and cerebral white matter suggestive of chronic small vessel disease.  Transthoracic Echocardiogram Study Conclusions - Left ventricle: The cavity size was normal. Wall thickness was   normal. Systolic function was normal. The estimated ejection   fraction was in the range of 60% to 65%. Wall motion was normal;   there were no regional wall motion abnormalities. Doppler   parameters are consistent with abnormal left ventricular   relaxation (grade 1 diastolic dysfunction). - Aortic valve: There was no stenosis. - Mitral valve: There was trivial regurgitation. - Right ventricle: The cavity size was normal. Systolic function   was normal. - Pulmonary arteries: PA peak pressure: 25 mm Hg (S). - Inferior vena cava: The vessel was normal in size. The   respirophasic diameter changes were in the normal  range (>= 50%),   consistent with normal central venous pressure.  Discharge Instructions: Discharge Instructions    Diet - low sodium heart healthy    Complete by:  As directed    Discharge instructions    Complete by:  As directed    Mr. Leask,  It is still unclear why you were found down near your mailbox and confused. MRI of the brain did not reveal a stroke or other abnormality that could cause seizures. The echocardiogram of your heart was normal and your heart is pumping normally, there were no other abnormalities found.  You had no infection or abnormality in your electrolytes or lab work. Your heart rhythm was monitored while you were hospitalized and we did not find any abnormal rhythms.   It is still possible that this is a seizure, but is unlikely and the medical team is not confident that this was caused from a seizure. It is possible that your blood pressure may have gotten low and this caused you to pass out and fall, but your blood pressure has been stable and in the normal range your entire hospitalization.   The results of your carotid artery ultrasound were still pending when you were discharged. I will make sure this is followed up by your PCP.   Aspirin has been added to your daily regimen of medications. Please take 81 mg of aspirin (baby aspirin) daily.   I would suggest taking your muscle relaxant Tizanidine at bed time, because it can cause sedation.   No other changes to your medications have been made. Please make a follow up appointment with your primary care physician or family doctor within 1 week of discharge from the hospital.   Increase activity slowly    Complete by:  As directed       Signed: Toney Rakes, MD 07/01/2017, 3:03 PM   Pager: [phone]

## 2017-07-01 NOTE — Progress Notes (Signed)
OT Screen    07/01/17 1300  OT Visit Information  Last OT Received On 07/01/17  Reason Eval/Treat Not Completed OT screened, no needs identified, will sign off (Per PT, pt completing ADLs in hospital setting near baseline funciton. Please re-order if status changes. Thank you.)   Curlene Dolphinharis Lisandro Meggett MSOT, OTR/L Acute Rehab Pager: [phone] Office: [phone]
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
## 2017-07-01 NOTE — Progress Notes (Signed)
Internal Medicine Attending:   I saw and examined the patient. I reviewed the resident's note and I agree with the resident's findings and plan as documented in the resident's note. No clear acute infarct on MRI does have small vessel disease, I do believe he would still benefit from 81mg  of aspirin on discharge.  Otherwise workup of syncopal episode and delirium unrevealing, I think that with his normal MRI I am less inclined to think this was seizure activity or a post ictal state, I discussed with him I cannot be sure of this.  I did discuss that recently starting a muscle relaxer could potentially have caused this and asked him to follow up with his PCP.  He otherwise is back to his baseline and after PT/OT evaluation and carotid dopplers he will be discharged this afternoon.

## 2017-07-01 NOTE — Progress Notes (Signed)
*  PRELIMINARY RESULTS* Vascular Ultrasound Carotid Duplex (Doppler) has been completed.  Preliminary findings: Right carotid evaluation demonstrates a 1-39% internal carotid artery stenosis. Findings consistent with a high end 40 - 59 percent stenosis involving the mid left internal carotid artery. Large amount of calcified plaque obscuring evaluation of proximal ICA, can not rule out a higher grade stenosis.  Bilateral vertebral arteries appear patent and antegrade.  Chauncey FischerCharlotte C Charell Faulk 07/01/2017, 11:34 AM

## 2017-07-01 NOTE — Progress Notes (Signed)
Pt currently refusing telemetry. Pt educated on importance. Pt still stating he would like to keep it off. WCTM

## 2017-07-02 ENCOUNTER — Ambulatory Visit: Payer: Self-pay | Admitting: Neurology

## 2017-07-02 ENCOUNTER — Telehealth: Payer: Self-pay | Admitting: *Deleted

## 2017-07-02 ENCOUNTER — Encounter (HOSPITAL_COMMUNITY): Payer: Self-pay | Admitting: Emergency Medicine

## 2017-07-02 LAB — VAS US CAROTID
LCCAPDIAS: 20 cm/s
LCCAPSYS: 107 cm/s
LEFT ECA DIAS: -13 cm/s
LEFT VERTEBRAL DIAS: -15 cm/s
LICADDIAS: -30 cm/s
LICAPSYS: -145 cm/s
Left CCA dist dias: -16 cm/s
Left CCA dist sys: -80 cm/s
Left ICA dist sys: -112 cm/s
Left ICA prox dias: -39 cm/s
RCCADSYS: -87 cm/s
RCCAPDIAS: -14 cm/s
RIGHT ECA DIAS: -12 cm/s
RIGHT VERTEBRAL DIAS: 20 cm/s
Right CCA prox sys: -68 cm/s

## 2017-07-02 NOTE — Telephone Encounter (Signed)
He was referred here for hospital follow up for seizures.  His [facility] with [facility] on 05/15/17.  He received a reminder call from our office on 06/30/17.  He called today at 12:41pm and stated he was unaware of the 2pm appt and canceled.

## 2017-07-03 ENCOUNTER — Encounter: Payer: Self-pay | Admitting: Neurology

## 2017-07-10 DIAGNOSIS — R55 Syncope and collapse: Secondary | ICD-10-CM | POA: Diagnosis not present

## 2017-07-10 DIAGNOSIS — R569 Unspecified convulsions: Secondary | ICD-10-CM | POA: Diagnosis not present

## 2017-07-13 DIAGNOSIS — I10 Essential (primary) hypertension: Secondary | ICD-10-CM | POA: Diagnosis not present

## 2017-07-13 DIAGNOSIS — S32021D Stable burst fracture of second lumbar vertebra, subsequent encounter for fracture with routine healing: Secondary | ICD-10-CM | POA: Diagnosis not present

## 2017-07-29 DIAGNOSIS — R569 Unspecified convulsions: Secondary | ICD-10-CM | POA: Diagnosis not present

## 2017-07-29 DIAGNOSIS — Z87898 Personal history of other specified conditions: Secondary | ICD-10-CM | POA: Diagnosis not present

## 2017-07-29 DIAGNOSIS — R55 Syncope and collapse: Secondary | ICD-10-CM | POA: Diagnosis not present

## 2017-07-29 DIAGNOSIS — I10 Essential (primary) hypertension: Secondary | ICD-10-CM | POA: Diagnosis not present

## 2017-08-14 DIAGNOSIS — M8000XD Age-related osteoporosis with current pathological fracture, unspecified site, subsequent encounter for fracture with routine healing: Secondary | ICD-10-CM | POA: Diagnosis not present

## 2017-08-14 DIAGNOSIS — Z0000 Encounter for general adult medical examination without abnormal findings: Secondary | ICD-10-CM | POA: Diagnosis not present

## 2017-08-14 DIAGNOSIS — E782 Mixed hyperlipidemia: Secondary | ICD-10-CM | POA: Diagnosis not present

## 2017-08-14 DIAGNOSIS — M8589 Other specified disorders of bone density and structure, multiple sites: Secondary | ICD-10-CM | POA: Diagnosis not present

## 2017-08-14 DIAGNOSIS — M8088XD Other osteoporosis with current pathological fracture, vertebra(e), subsequent encounter for fracture with routine healing: Secondary | ICD-10-CM | POA: Diagnosis not present

## 2017-08-14 DIAGNOSIS — K5730 Diverticulosis of large intestine without perforation or abscess without bleeding: Secondary | ICD-10-CM | POA: Diagnosis not present

## 2017-08-14 DIAGNOSIS — I10 Essential (primary) hypertension: Secondary | ICD-10-CM | POA: Diagnosis not present

## 2017-08-21 DIAGNOSIS — E782 Mixed hyperlipidemia: Secondary | ICD-10-CM | POA: Diagnosis not present

## 2017-08-21 DIAGNOSIS — R55 Syncope and collapse: Secondary | ICD-10-CM | POA: Diagnosis not present

## 2017-08-21 DIAGNOSIS — M8088XD Other osteoporosis with current pathological fracture, vertebra(e), subsequent encounter for fracture with routine healing: Secondary | ICD-10-CM | POA: Diagnosis not present

## 2017-08-21 DIAGNOSIS — K5730 Diverticulosis of large intestine without perforation or abscess without bleeding: Secondary | ICD-10-CM | POA: Diagnosis not present

## 2017-08-21 DIAGNOSIS — I10 Essential (primary) hypertension: Secondary | ICD-10-CM | POA: Diagnosis not present

## 2017-08-21 DIAGNOSIS — Z23 Encounter for immunization: Secondary | ICD-10-CM | POA: Diagnosis not present

## 2017-08-27 ENCOUNTER — Encounter: Payer: Self-pay | Admitting: Neurology

## 2017-08-27 ENCOUNTER — Ambulatory Visit: Payer: Medicare Other | Admitting: Neurology

## 2017-08-27 ENCOUNTER — Other Ambulatory Visit: Payer: Self-pay

## 2017-08-27 VITALS — BP 158/84 | HR 58 | Ht 67.0 in | Wt 145.5 lb

## 2017-08-27 DIAGNOSIS — R413 Other amnesia: Secondary | ICD-10-CM | POA: Diagnosis not present

## 2017-08-27 DIAGNOSIS — R569 Unspecified convulsions: Secondary | ICD-10-CM | POA: Diagnosis not present

## 2017-08-27 DIAGNOSIS — R55 Syncope and collapse: Secondary | ICD-10-CM

## 2017-08-27 HISTORY — DX: Other amnesia: R41.3
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
## 2017-08-27 NOTE — Patient Instructions (Signed)
   We will check an EEG study. 

## 2017-09-02 ENCOUNTER — Telehealth: Payer: Self-pay | Admitting: Neurology

## 2017-09-02 ENCOUNTER — Ambulatory Visit: Payer: Medicare Other | Admitting: Neurology

## 2017-09-02 DIAGNOSIS — R55 Syncope and collapse: Secondary | ICD-10-CM | POA: Diagnosis not present

## 2017-09-02 DIAGNOSIS — R569 Unspecified convulsions: Secondary | ICD-10-CM

## 2017-09-02 NOTE — Procedures (Signed)
    History:  Tommy Jensen is a 76 year old gentleman with a history of alcohol abuse.  The patient had a seizure on 05 May 2017 after cessation of drinking several days prior.  The patient however had a more recent event when he was found down unresponsive at his mailbox, he had not been drinking prior to this event.  The patient is being evaluated for seizures.  This is a routine EEG.  No skull defects are noted.  Medication includes aspirin, Celebrex, Cosopt, magnesium, MiraLAX, Senokot, thiamine, Zanaflex, and timolol.  EEG classification: Normal awake  Description of the recording: The background rhythms of this recording consists of a fairly well modulated medium amplitude alpha rhythm of 8 Hz that is reactive to eye opening and closure. As the record progresses, the patient appears to remain in the waking state throughout the recording. Photic stimulation was performed, resulting in a bilateral and symmetric photic driving response. Hyperventilation was also performed, resulting in a minimal buildup of the background rhythm activities without significant slowing seen. At no time during the recording does there appear to be evidence of spike or spike wave discharges or evidence of focal slowing. EKG monitor shows no evidence of cardiac rhythm abnormalities with a heart rate of 54.  Impression: This is a normal EEG recording in the waking state. No evidence of ictal or interictal discharges are seen.

## 2017-09-02 NOTE — Telephone Encounter (Signed)
I called the patient.  The EEG study was normal.  The patient will contact our office if any other event is noted.  Patient will follow-up in several months.

## 2017-09-03 DIAGNOSIS — I10 Essential (primary) hypertension: Secondary | ICD-10-CM | POA: Diagnosis not present

## 2017-09-03 DIAGNOSIS — E785 Hyperlipidemia, unspecified: Secondary | ICD-10-CM | POA: Diagnosis not present

## 2017-09-03 DIAGNOSIS — I6523 Occlusion and stenosis of bilateral carotid arteries: Secondary | ICD-10-CM | POA: Diagnosis not present

## 2017-09-03 DIAGNOSIS — R55 Syncope and collapse: Secondary | ICD-10-CM | POA: Diagnosis not present

## 2017-10-09 DIAGNOSIS — R55 Syncope and collapse: Secondary | ICD-10-CM | POA: Diagnosis not present

## 2017-10-22 DIAGNOSIS — I10 Essential (primary) hypertension: Secondary | ICD-10-CM | POA: Diagnosis not present

## 2017-10-22 DIAGNOSIS — E785 Hyperlipidemia, unspecified: Secondary | ICD-10-CM | POA: Diagnosis not present

## 2017-10-26 DIAGNOSIS — I10 Essential (primary) hypertension: Secondary | ICD-10-CM | POA: Diagnosis not present

## 2017-10-26 DIAGNOSIS — E782 Mixed hyperlipidemia: Secondary | ICD-10-CM | POA: Diagnosis not present

## 2017-10-29 DIAGNOSIS — Z87898 Personal history of other specified conditions: Secondary | ICD-10-CM | POA: Diagnosis not present

## 2017-10-29 DIAGNOSIS — R55 Syncope and collapse: Secondary | ICD-10-CM | POA: Diagnosis not present

## 2017-10-29 DIAGNOSIS — I10 Essential (primary) hypertension: Secondary | ICD-10-CM | POA: Diagnosis not present

## 2017-10-29 DIAGNOSIS — I6523 Occlusion and stenosis of bilateral carotid arteries: Secondary | ICD-10-CM | POA: Diagnosis not present

## 2017-12-10 DIAGNOSIS — H401121 Primary open-angle glaucoma, left eye, mild stage: Secondary | ICD-10-CM | POA: Diagnosis not present

## 2017-12-10 DIAGNOSIS — H33051 Total retinal detachment, right eye: Secondary | ICD-10-CM | POA: Diagnosis not present

## 2017-12-10 DIAGNOSIS — H401113 Primary open-angle glaucoma, right eye, severe stage: Secondary | ICD-10-CM | POA: Diagnosis not present

## 2017-12-10 DIAGNOSIS — H43392 Other vitreous opacities, left eye: Secondary | ICD-10-CM | POA: Diagnosis not present

## 2017-12-10 DIAGNOSIS — Z961 Presence of intraocular lens: Secondary | ICD-10-CM | POA: Diagnosis not present

## 2017-12-23 ENCOUNTER — Ambulatory Visit: Payer: Medicare Other | Admitting: Adult Health

## 2017-12-23 ENCOUNTER — Encounter: Payer: Self-pay | Admitting: Adult Health

## 2017-12-23 VITALS — BP 160/90 | HR 68 | Ht 67.0 in | Wt 150.0 lb

## 2017-12-23 DIAGNOSIS — R569 Unspecified convulsions: Secondary | ICD-10-CM | POA: Diagnosis not present

## 2017-12-23 NOTE — Progress Notes (Signed)
I have read the note, and I agree with the clinical assessment and plan.  Charles K Willis   
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
## 2017-12-23 NOTE — Patient Instructions (Signed)
Your Plan:  Continue to monitor symptoms If you have any additional seizure like events please let us know If your symptoms worsen or you develop new symptoms please let us know.   [facility] you for [facility]. I hope we have been able to provide you high quality care today.  You may receive a patient satisfaction survey over the next few weeks. We would appreciate your feedback and comments so that we may continue to improve ourselves and the health of our patients.

## 2017-12-24 DIAGNOSIS — E782 Mixed hyperlipidemia: Secondary | ICD-10-CM | POA: Diagnosis not present

## 2017-12-24 DIAGNOSIS — I10 Essential (primary) hypertension: Secondary | ICD-10-CM | POA: Diagnosis not present

## 2017-12-31 DIAGNOSIS — M2550 Pain in unspecified joint: Secondary | ICD-10-CM | POA: Diagnosis not present

## 2017-12-31 DIAGNOSIS — R413 Other amnesia: Secondary | ICD-10-CM | POA: Diagnosis not present

## 2017-12-31 DIAGNOSIS — I10 Essential (primary) hypertension: Secondary | ICD-10-CM | POA: Diagnosis not present

## 2017-12-31 DIAGNOSIS — E782 Mixed hyperlipidemia: Secondary | ICD-10-CM | POA: Diagnosis not present

## 2017-12-31 DIAGNOSIS — M545 Low back pain: Secondary | ICD-10-CM | POA: Diagnosis not present

## 2018-05-20 DIAGNOSIS — E782 Mixed hyperlipidemia: Secondary | ICD-10-CM | POA: Diagnosis not present

## 2018-05-20 DIAGNOSIS — I10 Essential (primary) hypertension: Secondary | ICD-10-CM | POA: Diagnosis not present

## 2018-05-26 DIAGNOSIS — Z23 Encounter for immunization: Secondary | ICD-10-CM | POA: Diagnosis not present

## 2018-05-26 DIAGNOSIS — I10 Essential (primary) hypertension: Secondary | ICD-10-CM | POA: Diagnosis not present

## 2018-05-26 DIAGNOSIS — E782 Mixed hyperlipidemia: Secondary | ICD-10-CM | POA: Diagnosis not present

## 2018-05-26 DIAGNOSIS — J301 Allergic rhinitis due to pollen: Secondary | ICD-10-CM | POA: Diagnosis not present

## 2018-05-26 DIAGNOSIS — R3912 Poor urinary stream: Secondary | ICD-10-CM | POA: Diagnosis not present

## 2018-06-03 ENCOUNTER — Ambulatory Visit (INDEPENDENT_AMBULATORY_CARE_PROVIDER_SITE_OTHER): Payer: Medicare Other

## 2018-06-03 ENCOUNTER — Other Ambulatory Visit: Payer: Self-pay

## 2018-06-03 ENCOUNTER — Ambulatory Visit (HOSPITAL_COMMUNITY)
Admission: EM | Admit: 2018-06-03 | Discharge: 2018-06-03 | Disposition: A | Discharge status: Home / Self Care | Payer: Medicare Other | Attending: Family Medicine | Admitting: Family Medicine

## 2018-06-03 ENCOUNTER — Encounter (HOSPITAL_COMMUNITY): Payer: Self-pay | Admitting: *Deleted

## 2018-06-03 DIAGNOSIS — M7989 Other specified soft tissue disorders: Secondary | ICD-10-CM | POA: Diagnosis not present

## 2018-06-03 DIAGNOSIS — M79675 Pain in left toe(s): Secondary | ICD-10-CM | POA: Diagnosis not present

## 2018-06-03 DIAGNOSIS — S90112A Contusion of left great toe without damage to nail, initial encounter: Secondary | ICD-10-CM

## 2018-06-03 DIAGNOSIS — W208XXA Other cause of strike by thrown, projected or falling object, initial encounter: Secondary | ICD-10-CM

## 2018-06-03 DIAGNOSIS — S99922A Unspecified injury of left foot, initial encounter: Secondary | ICD-10-CM | POA: Diagnosis not present

## 2018-06-03 NOTE — Discharge Instructions (Signed)
Your xray is normal today without any evidence of broken bones.  Your toe is obviously bruised which is painful.  Tylenol or ibuprofen as needed for pain.  Ice, elevation.  Supportive shoes, buddy taping the toes may be comfortable as well.  If symptoms worsen or do not improve in the next 2-3 weeks to return to be seen or to follow up with your PCP.

## 2018-06-03 NOTE — ED Triage Notes (Signed)
States he was unloading some logs yest and one fell on is left great toe, toe is bruised.
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
## 2018-06-10 DIAGNOSIS — H401113 Primary open-angle glaucoma, right eye, severe stage: Secondary | ICD-10-CM | POA: Diagnosis not present

## 2018-06-10 DIAGNOSIS — H401121 Primary open-angle glaucoma, left eye, mild stage: Secondary | ICD-10-CM | POA: Diagnosis not present

## 2018-06-10 DIAGNOSIS — Z961 Presence of intraocular lens: Secondary | ICD-10-CM | POA: Diagnosis not present
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
## 2018-08-18 DIAGNOSIS — Z23 Encounter for immunization: Secondary | ICD-10-CM | POA: Diagnosis not present

## 2018-08-18 DIAGNOSIS — E782 Mixed hyperlipidemia: Secondary | ICD-10-CM | POA: Diagnosis not present

## 2018-08-18 DIAGNOSIS — I10 Essential (primary) hypertension: Secondary | ICD-10-CM | POA: Diagnosis not present

## 2018-08-18 DIAGNOSIS — Z0000 Encounter for general adult medical examination without abnormal findings: Secondary | ICD-10-CM | POA: Diagnosis not present

## 2018-08-25 DIAGNOSIS — E782 Mixed hyperlipidemia: Secondary | ICD-10-CM | POA: Diagnosis not present

## 2018-08-25 DIAGNOSIS — Z0000 Encounter for general adult medical examination without abnormal findings: Secondary | ICD-10-CM | POA: Diagnosis not present

## 2018-10-01 DIAGNOSIS — K219 Gastro-esophageal reflux disease without esophagitis: Secondary | ICD-10-CM | POA: Diagnosis not present

## 2018-10-01 DIAGNOSIS — Z87891 Personal history of nicotine dependence: Secondary | ICD-10-CM | POA: Diagnosis not present

## 2018-10-01 DIAGNOSIS — R490 Dysphonia: Secondary | ICD-10-CM | POA: Diagnosis not present

## 2018-10-04 DIAGNOSIS — I6523 Occlusion and stenosis of bilateral carotid arteries: Secondary | ICD-10-CM | POA: Diagnosis not present

## 2018-11-15 ENCOUNTER — Emergency Department (HOSPITAL_COMMUNITY): Payer: Medicare Other

## 2018-11-15 ENCOUNTER — Inpatient Hospital Stay (HOSPITAL_COMMUNITY)
Admission: EM | Admit: 2018-11-15 | Discharge: 2018-11-18 | DRG: 872 | Disposition: A | Discharge status: Home / Self Care | Payer: Medicare Other | Attending: Internal Medicine | Admitting: Internal Medicine

## 2018-11-15 ENCOUNTER — Encounter (HOSPITAL_COMMUNITY): Payer: Self-pay | Admitting: Emergency Medicine

## 2018-11-15 ENCOUNTER — Other Ambulatory Visit: Payer: Self-pay

## 2018-11-15 DIAGNOSIS — E7800 Pure hypercholesterolemia, unspecified: Secondary | ICD-10-CM | POA: Diagnosis present

## 2018-11-15 DIAGNOSIS — G40909 Epilepsy, unspecified, not intractable, without status epilepticus: Secondary | ICD-10-CM | POA: Diagnosis present

## 2018-11-15 DIAGNOSIS — E860 Dehydration: Secondary | ICD-10-CM | POA: Diagnosis not present

## 2018-11-15 DIAGNOSIS — H409 Unspecified glaucoma: Secondary | ICD-10-CM | POA: Diagnosis present

## 2018-11-15 DIAGNOSIS — A4151 Sepsis due to Escherichia coli [E. coli]: Principal | ICD-10-CM | POA: Diagnosis present

## 2018-11-15 DIAGNOSIS — R079 Chest pain, unspecified: Secondary | ICD-10-CM | POA: Diagnosis not present

## 2018-11-15 DIAGNOSIS — Z961 Presence of intraocular lens: Secondary | ICD-10-CM | POA: Diagnosis present

## 2018-11-15 DIAGNOSIS — M545 Low back pain: Secondary | ICD-10-CM | POA: Diagnosis not present

## 2018-11-15 DIAGNOSIS — F419 Anxiety disorder, unspecified: Secondary | ICD-10-CM | POA: Diagnosis present

## 2018-11-15 DIAGNOSIS — E876 Hypokalemia: Secondary | ICD-10-CM | POA: Diagnosis not present

## 2018-11-15 DIAGNOSIS — D696 Thrombocytopenia, unspecified: Secondary | ICD-10-CM | POA: Diagnosis present

## 2018-11-15 DIAGNOSIS — N171 Acute kidney failure with acute cortical necrosis: Secondary | ICD-10-CM

## 2018-11-15 DIAGNOSIS — N390 Urinary tract infection, site not specified: Secondary | ICD-10-CM | POA: Diagnosis present

## 2018-11-15 DIAGNOSIS — E872 Acidosis, unspecified: Secondary | ICD-10-CM | POA: Diagnosis present

## 2018-11-15 DIAGNOSIS — Z87891 Personal history of nicotine dependence: Secondary | ICD-10-CM

## 2018-11-15 DIAGNOSIS — Z880 Allergy status to penicillin: Secondary | ICD-10-CM

## 2018-11-15 DIAGNOSIS — I10 Essential (primary) hypertension: Secondary | ICD-10-CM | POA: Diagnosis present

## 2018-11-15 DIAGNOSIS — N3000 Acute cystitis without hematuria: Secondary | ICD-10-CM | POA: Diagnosis not present

## 2018-11-15 DIAGNOSIS — D72829 Elevated white blood cell count, unspecified: Secondary | ICD-10-CM | POA: Diagnosis present

## 2018-11-15 DIAGNOSIS — N179 Acute kidney failure, unspecified: Secondary | ICD-10-CM | POA: Diagnosis present

## 2018-11-15 DIAGNOSIS — F1020 Alcohol dependence, uncomplicated: Secondary | ICD-10-CM | POA: Diagnosis present

## 2018-11-15 DIAGNOSIS — Z9842 Cataract extraction status, left eye: Secondary | ICD-10-CM | POA: Diagnosis not present

## 2018-11-15 DIAGNOSIS — E785 Hyperlipidemia, unspecified: Secondary | ICD-10-CM | POA: Diagnosis present

## 2018-11-15 DIAGNOSIS — R1310 Dysphagia, unspecified: Secondary | ICD-10-CM | POA: Diagnosis not present

## 2018-11-15 DIAGNOSIS — Z888 Allergy status to other drugs, medicaments and biological substances status: Secondary | ICD-10-CM

## 2018-11-15 DIAGNOSIS — D6959 Other secondary thrombocytopenia: Secondary | ICD-10-CM | POA: Diagnosis not present

## 2018-11-15 DIAGNOSIS — G8929 Other chronic pain: Secondary | ICD-10-CM | POA: Diagnosis present

## 2018-11-15 DIAGNOSIS — K219 Gastro-esophageal reflux disease without esophagitis: Secondary | ICD-10-CM | POA: Diagnosis present

## 2018-11-15 DIAGNOSIS — Z7982 Long term (current) use of aspirin: Secondary | ICD-10-CM

## 2018-11-15 DIAGNOSIS — Z9841 Cataract extraction status, right eye: Secondary | ICD-10-CM

## 2018-11-15 DIAGNOSIS — Z79899 Other long term (current) drug therapy: Secondary | ICD-10-CM

## 2018-11-15 LAB — LIPASE, BLOOD: Lipase: 40 U/L (ref 11–51)

## 2018-11-15 LAB — URINALYSIS, ROUTINE W REFLEX MICROSCOPIC
BILIRUBIN URINE: NEGATIVE
GLUCOSE, UA: NEGATIVE mg/dL
KETONES UR: NEGATIVE mg/dL
NITRITE: NEGATIVE
PH: 5 (ref 5.0–8.0)
Protein, ur: >=300 mg/dL — AB
Specific Gravity, Urine: 1.016 (ref 1.005–1.030)

## 2018-11-15 LAB — I-STAT TROPONIN, ED: Troponin i, poc: 0 ng/mL (ref 0.00–0.08)

## 2018-11-15 LAB — HEPATIC FUNCTION PANEL
ALBUMIN: 3.7 g/dL (ref 3.5–5.0)
ALK PHOS: 87 U/L (ref 38–126)
ALT: 14 U/L (ref 0–44)
AST: 26 U/L (ref 15–41)
BILIRUBIN TOTAL: 1.1 mg/dL (ref 0.3–1.2)
Bilirubin, Direct: 0.4 mg/dL — ABNORMAL HIGH (ref 0.0–0.2)
Indirect Bilirubin: 0.7 mg/dL (ref 0.3–0.9)
Total Protein: 7.8 g/dL (ref 6.5–8.1)

## 2018-11-15 LAB — BASIC METABOLIC PANEL
Anion gap: 15 (ref 5–15)
BUN: 50 mg/dL — AB (ref 8–23)
CALCIUM: 9 mg/dL (ref 8.9–10.3)
CO2: 22 mmol/L (ref 22–32)
CREATININE: 2.12 mg/dL — AB (ref 0.61–1.24)
Chloride: 98 mmol/L (ref 98–111)
GFR calc Af Amer: 34 mL/min — ABNORMAL LOW (ref >60)
GFR calc non Af Amer: 30 mL/min — ABNORMAL LOW (ref >60)
GLUCOSE: 210 mg/dL — AB (ref 70–99)
Potassium: 3 mmol/L — ABNORMAL LOW (ref 3.5–5.1)
Sodium: 135 mmol/L (ref 135–145)

## 2018-11-15 LAB — CBC
HEMATOCRIT: 36.8 % — AB (ref 39.0–52.0)
Hemoglobin: 12.2 g/dL — ABNORMAL LOW (ref 13.0–17.0)
MCH: 32.4 pg (ref 26.0–34.0)
MCHC: 33.2 g/dL (ref 30.0–36.0)
MCV: 97.6 fL (ref 80.0–100.0)
Platelets: 144 10*3/uL — ABNORMAL LOW (ref 150–400)
RBC: 3.77 MIL/uL — ABNORMAL LOW (ref 4.22–5.81)
RDW: 14.9 % (ref 11.5–15.5)
WBC: 28.7 10*3/uL — ABNORMAL HIGH (ref 4.0–10.5)
nRBC: 0 % (ref 0.0–0.2)

## 2018-11-15 LAB — LACTIC ACID, PLASMA
Lactic Acid, Venous: 2.8 mmol/L (ref 0.5–1.9)
Lactic Acid, Venous: 1.8 mmol/L (ref 0.5–1.9)

## 2018-11-15 LAB — MONONUCLEOSIS SCREEN: Mono Screen: NEGATIVE

## 2018-11-15 LAB — ETHANOL: Alcohol, Ethyl (B): <10 mg/dL (ref <10)

## 2018-11-15 LAB — GROUP A STREP BY PCR: Group A Strep by PCR: NOT DETECTED

## 2018-11-15 MED ORDER — ONDANSETRON HCL 4 MG/2ML IJ SOLN
4.0000 mg | Freq: Once | INTRAMUSCULAR | Status: AC
  Administered 2018-11-15: 4 mg via INTRAVENOUS
  Filled 2018-11-15: qty 2

## 2018-11-15 MED ORDER — HEPARIN SODIUM (PORCINE) 5000 UNIT/ML IJ SOLN
5000.0000 [IU] | Freq: Three times a day (TID) | INTRAMUSCULAR | Status: DC
  Administered 2018-11-16 – 2018-11-17 (×5): 5000 [IU] via SUBCUTANEOUS
  Filled 2018-11-15 (×6): qty 1

## 2018-11-15 MED ORDER — ACETAMINOPHEN 325 MG PO TABS: 325.0000 mg | ORAL_TABLET | Freq: Four times a day (QID) | ORAL | Status: DC | PRN

## 2018-11-15 MED ORDER — LATANOPROST 0.005 % OP SOLN
1.0000 [drp] | Freq: Every day | OPHTHALMIC | Status: DC
  Administered 2018-11-17: 1 [drp] via OPHTHALMIC
  Filled 2018-11-15: qty 2.5

## 2018-11-15 MED ORDER — ONDANSETRON HCL 4 MG PO TABS: 4.0000 mg | ORAL_TABLET | Freq: Four times a day (QID) | ORAL | Status: DC | PRN

## 2018-11-15 MED ORDER — ASPIRIN EC 81 MG PO TBEC
81.0000 mg | DELAYED_RELEASE_TABLET | Freq: Every day | ORAL | Status: DC
  Administered 2018-11-16 – 2018-11-18 (×3): 81 mg via ORAL
  Filled 2018-11-15 (×3): qty 1

## 2018-11-15 MED ORDER — DEXTROSE-NACL 5-0.9 % IV SOLN
INTRAVENOUS | Status: DC
  Administered 2018-11-15: 22:00:00 via INTRAVENOUS

## 2018-11-15 MED ORDER — VITAMIN B-12 1000 MCG PO TABS
1000.0000 ug | ORAL_TABLET | Freq: Every day | ORAL | Status: DC
  Administered 2018-11-16 – 2018-11-18 (×3): 1000 ug via ORAL
  Filled 2018-11-15 (×3): qty 1

## 2018-11-15 MED ORDER — SODIUM CHLORIDE 0.9 % IV SOLN
1.0000 g | Freq: Once | INTRAVENOUS | Status: AC
  Administered 2018-11-15: 1 g via INTRAVENOUS
  Filled 2018-11-15: qty 10

## 2018-11-15 MED ORDER — SODIUM CHLORIDE 0.9 % IV SOLN: 1.0000 g | INTRAVENOUS | Status: DC

## 2018-11-15 MED ORDER — VITAMIN B-1 100 MG PO TABS
100.0000 mg | ORAL_TABLET | Freq: Every day | ORAL | Status: DC
  Administered 2018-11-16 – 2018-11-18 (×3): 100 mg via ORAL
  Filled 2018-11-15 (×3): qty 1

## 2018-11-15 MED ORDER — POTASSIUM CHLORIDE CRYS ER 20 MEQ PO TBCR
40.0000 meq | EXTENDED_RELEASE_TABLET | Freq: Once | ORAL | Status: AC
  Administered 2018-11-15: 40 meq via ORAL
  Filled 2018-11-15: qty 2

## 2018-11-15 MED ORDER — SODIUM CHLORIDE 0.9 % IV BOLUS
1000.0000 mL | Freq: Once | INTRAVENOUS | Status: AC
  Administered 2018-11-15: 1000 mL via INTRAVENOUS

## 2018-11-15 MED ORDER — THIAMINE HCL 100 MG/ML IJ SOLN
Freq: Once | INTRAVENOUS | Status: AC
  Administered 2018-11-16: 04:00:00 via INTRAVENOUS
  Filled 2018-11-15: qty 1000

## 2018-11-15 MED ORDER — IRBESARTAN 150 MG PO TABS
300.0000 mg | ORAL_TABLET | Freq: Every day | ORAL | Status: DC
  Filled 2018-11-15: qty 2

## 2018-11-15 MED ORDER — VITAMIN D 25 MCG (1000 UNIT) PO TABS
2000.0000 [IU] | ORAL_TABLET | Freq: Every day | ORAL | Status: DC
  Administered 2018-11-16 – 2018-11-18 (×3): 2000 [IU] via ORAL
  Filled 2018-11-15 (×3): qty 2

## 2018-11-15 MED ORDER — ACETAMINOPHEN 325 MG PO TABS
650.0000 mg | ORAL_TABLET | Freq: Once | ORAL | Status: AC
  Administered 2018-11-15: 650 mg via ORAL
  Filled 2018-11-15: qty 2

## 2018-11-15 MED ORDER — FAMOTIDINE IN NACL 20-0.9 MG/50ML-% IV SOLN
20.0000 mg | Freq: Once | INTRAVENOUS | Status: AC
  Administered 2018-11-15: 20 mg via INTRAVENOUS
  Filled 2018-11-15: qty 50

## 2018-11-15 MED ORDER — DORZOLAMIDE HCL-TIMOLOL MAL 2-0.5 % OP SOLN
1.0000 [drp] | Freq: Two times a day (BID) | OPHTHALMIC | Status: DC
  Administered 2018-11-16 – 2018-11-18 (×5): 1 [drp] via OPHTHALMIC
  Filled 2018-11-15: qty 10

## 2018-11-15 MED ORDER — ONDANSETRON HCL 4 MG/2ML IJ SOLN: 4.0000 mg | Freq: Four times a day (QID) | INTRAMUSCULAR | Status: DC | PRN

## 2018-11-15 MED ORDER — MAGNESIUM OXIDE 400 (241.3 MG) MG PO TABS
400.0000 mg | ORAL_TABLET | Freq: Every day | ORAL | Status: DC
  Administered 2018-11-16 – 2018-11-18 (×3): 400 mg via ORAL
  Filled 2018-11-15 (×3): qty 1
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
## 2018-11-15 NOTE — ED Triage Notes (Signed)
Pt reports had sore throat and not eating for 2 days. Generalized intermittent chest pains for 4-5 days. Pt reports felt faint today walking from kitchen to dinning room .

## 2018-11-15 NOTE — ED Triage Notes (Signed)
Pt adds that he started back drinking 1/2 litter liquor per day 3-4 days ago.
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
## 2018-11-15 NOTE — ED Notes (Signed)
Obtained blood cultures x 1 set, and a rainbow of tubes for main lab from Left AC IV site.

## 2018-11-15 NOTE — ED Notes (Addendum)
Report given to Eunice Blase, RN on 5E.
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
## 2018-11-15 NOTE — ED Notes (Signed)
CRITICAL VALUE STICKER  CRITICAL VALUE: lactic acid 2.8 RECEIVER (on-site recipient of call): Celeste , RN   DATE & TIME NOTIFIED:  1445/ 11/15/18  MESSENGER (representative from lab):Jay   MD NOTIFIED: Dr. Silverio Lay  TIME OF NOTIFICATION:1446  RESPONSE: awating orders

## 2018-11-16 ENCOUNTER — Inpatient Hospital Stay (HOSPITAL_COMMUNITY): Payer: Medicare Other

## 2018-11-16 LAB — BLOOD CULTURE ID PANEL (REFLEXED)
Acinetobacter baumannii: NOT DETECTED
CANDIDA GLABRATA: NOT DETECTED
Candida albicans: NOT DETECTED
Candida krusei: NOT DETECTED
Candida parapsilosis: NOT DETECTED
Candida tropicalis: NOT DETECTED
Carbapenem resistance: NOT DETECTED
ESCHERICHIA COLI: DETECTED — AB
Enterobacter cloacae complex: NOT DETECTED
Enterobacteriaceae species: DETECTED — AB
Enterococcus species: NOT DETECTED
Haemophilus influenzae: NOT DETECTED
Klebsiella oxytoca: NOT DETECTED
Klebsiella pneumoniae: NOT DETECTED
LISTERIA MONOCYTOGENES: NOT DETECTED
Neisseria meningitidis: NOT DETECTED
Proteus species: NOT DETECTED
Pseudomonas aeruginosa: NOT DETECTED
SERRATIA MARCESCENS: NOT DETECTED
Staphylococcus aureus (BCID): NOT DETECTED
Staphylococcus species: NOT DETECTED
Streptococcus agalactiae: NOT DETECTED
Streptococcus pneumoniae: NOT DETECTED
Streptococcus pyogenes: NOT DETECTED
Streptococcus species: NOT DETECTED

## 2018-11-16 LAB — COMPREHENSIVE METABOLIC PANEL
ALK PHOS: 80 U/L (ref 38–126)
ALT: 13 U/L (ref 0–44)
AST: 19 U/L (ref 15–41)
Albumin: 3.2 g/dL — ABNORMAL LOW (ref 3.5–5.0)
Anion gap: 12 (ref 5–15)
BILIRUBIN TOTAL: 1.1 mg/dL (ref 0.3–1.2)
BUN: 58 mg/dL — ABNORMAL HIGH (ref 8–23)
CO2: 20 mmol/L — ABNORMAL LOW (ref 22–32)
Calcium: 8.2 mg/dL — ABNORMAL LOW (ref 8.9–10.3)
Chloride: 103 mmol/L (ref 98–111)
Creatinine, Ser: 2.41 mg/dL — ABNORMAL HIGH (ref 0.61–1.24)
GFR calc Af Amer: 29 mL/min — ABNORMAL LOW (ref >60)
GFR calc non Af Amer: 25 mL/min — ABNORMAL LOW (ref >60)
Glucose, Bld: 152 mg/dL — ABNORMAL HIGH (ref 70–99)
Potassium: 3.5 mmol/L (ref 3.5–5.1)
Sodium: 135 mmol/L (ref 135–145)
TOTAL PROTEIN: 6.8 g/dL (ref 6.5–8.1)

## 2018-11-16 LAB — CBC
HCT: 33.7 % — ABNORMAL LOW (ref 39.0–52.0)
Hemoglobin: 11 g/dL — ABNORMAL LOW (ref 13.0–17.0)
MCH: 32.7 pg (ref 26.0–34.0)
MCHC: 32.6 g/dL (ref 30.0–36.0)
MCV: 100.3 fL — ABNORMAL HIGH (ref 80.0–100.0)
Platelets: 103 10*3/uL — ABNORMAL LOW (ref 150–400)
RBC: 3.36 MIL/uL — ABNORMAL LOW (ref 4.22–5.81)
RDW: 15.6 % — ABNORMAL HIGH (ref 11.5–15.5)
WBC: 17.2 10*3/uL — AB (ref 4.0–10.5)
nRBC: 0 % (ref 0.0–0.2)

## 2018-11-16 LAB — PROTEIN, URINE, RANDOM: Total Protein, Urine: 255 mg/dL

## 2018-11-16 LAB — SODIUM, URINE, RANDOM: Sodium, Ur: 19 mmol/L

## 2018-11-16 MED ORDER — ACETAMINOPHEN 325 MG PO TABS
650.0000 mg | ORAL_TABLET | Freq: Four times a day (QID) | ORAL | Status: DC | PRN
  Administered 2018-11-16: 650 mg via ORAL
  Filled 2018-11-16: qty 2

## 2018-11-16 MED ORDER — SODIUM CHLORIDE 0.9 % IV SOLN
2.0000 g | INTRAVENOUS | Status: AC
  Administered 2018-11-16 – 2018-11-18 (×3): 2 g via INTRAVENOUS
  Filled 2018-11-16 (×3): qty 2

## 2018-11-16 MED ORDER — FOLIC ACID 1 MG PO TABS
1.0000 mg | ORAL_TABLET | Freq: Every day | ORAL | Status: DC
  Administered 2018-11-17 – 2018-11-18 (×2): 1 mg via ORAL
  Filled 2018-11-16 (×2): qty 1

## 2018-11-16 MED ORDER — SODIUM CHLORIDE 0.9 % IV SOLN
INTRAVENOUS | Status: DC
  Administered 2018-11-16 – 2018-11-17 (×4): via INTRAVENOUS

## 2018-11-16 MED ORDER — POTASSIUM CHLORIDE CRYS ER 20 MEQ PO TBCR
40.0000 meq | EXTENDED_RELEASE_TABLET | Freq: Once | ORAL | Status: AC
  Administered 2018-11-16: 40 meq via ORAL
  Filled 2018-11-16: qty 2

## 2018-11-16 NOTE — Progress Notes (Addendum)
Pt has arrived from the ED, walked from doorway into room / bed. Gait unsteady, diaphoretic( gown wet but no temp). Will monitor for DTs with last ETOH consumed Sunday per pt who states he had resumed drinking 1/2 L a day but last drink was on Sunday. While pt was in the BR he took his medications he brought from home ( to include eye drops & Benicar

## 2018-11-16 NOTE — Progress Notes (Signed)
PHARMACY - PHYSICIAN COMMUNICATION CRITICAL VALUE ALERT - BLOOD CULTURE IDENTIFICATION (BCID)  Tommy Jensen is an 78 y.o. male who presented to [facility] on 11/15/2018 with a chief complaint of chest pain, sore throat and complaints of dysuria.  Assessment:  Antibiotics started on admission for suspected UTI   Name of physician (or Provider) Contacted: Dr. Renford Dills  Current antibiotics: Ceftriaxone 2 g IV daily  Changes to prescribed antibiotics recommended: No changes to antibiotics, dose of ceftriaxone already increased to 2 g IV daily. Follow cultures for sensitivities.  Results for orders placed or performed during the hospital encounter of 11/15/18  Blood Culture ID Panel (Reflexed) (Collected: 11/15/2018  3:53 PM)  Result Value Ref Range   Enterococcus species NOT DETECTED NOT DETECTED   Listeria monocytogenes NOT DETECTED NOT DETECTED   Staphylococcus species NOT DETECTED NOT DETECTED   Staphylococcus aureus (BCID) NOT DETECTED NOT DETECTED   Streptococcus species NOT DETECTED NOT DETECTED   Streptococcus agalactiae NOT DETECTED NOT DETECTED   Streptococcus pneumoniae NOT DETECTED NOT DETECTED   Streptococcus pyogenes NOT DETECTED NOT DETECTED   Acinetobacter baumannii NOT DETECTED NOT DETECTED   Enterobacteriaceae species DETECTED (A) NOT DETECTED   Enterobacter cloacae complex NOT DETECTED NOT DETECTED   Escherichia coli DETECTED (A) NOT DETECTED   Klebsiella oxytoca NOT DETECTED NOT DETECTED   Klebsiella pneumoniae NOT DETECTED NOT DETECTED   Proteus species NOT DETECTED NOT DETECTED   Serratia marcescens NOT DETECTED NOT DETECTED   Carbapenem resistance NOT DETECTED NOT DETECTED   Haemophilus influenzae NOT DETECTED NOT DETECTED   Neisseria meningitidis NOT DETECTED NOT DETECTED   Pseudomonas aeruginosa NOT DETECTED NOT DETECTED   Candida albicans NOT DETECTED NOT DETECTED   Candida glabrata NOT DETECTED NOT DETECTED   Candida krusei NOT DETECTED NOT DETECTED   Candida parapsilosis NOT DETECTED NOT DETECTED   Candida tropicalis NOT DETECTED NOT DETECTED    Cindi Carbon, PharmD 11/16/2018  10:09 AM

## 2018-11-16 NOTE — Progress Notes (Signed)
PROGRESS NOTE    Tommy Jensen  ZOX:[id number] DOB: 06-17-1941 DOA: 11/15/2018 PCP: Georgianne Fick, MD   Brief Narrative: Patient is a 77-year male with history of chronic alcohol abuse, anxiety, and GERD, hyperlipidemia, hypertension, seizure disorder not on seizure meds, hyperlipidemia who presents to the emergency department with nonspecific complaints including chest pain, sore throat, generalized malaise, decreased energy, increased frequency of urination and mild dysuria.  Patient was found to have mild fever on presentation with elevated white cell counts.  Urine was suggestive of urine tract infection.  Also found to have acute kidney injury.  Currently has been managed for gram-negative bacteremia secondary urinary tract infection.  Assessment & Plan:   Principal Problem:   UTI (urinary tract infection) Active Problems:   Hypercholesteremia   Essential hypertension   Hypokalemia   Alcoholism (HCC)   Thrombocytopenia (HCC)   Leucocytosis   Lactic acidosis   ARF (acute renal failure) (HCC)   Gram-negative bacteremia/sepsis/urinary tract infection: Presented with fever, increased frequency and dysuria.  Urinalysis suggestive of urinary tract infection.  Blood cultures showing gram-negative rods.  Continue ceftriaxone 2 g IV daily.  Currently is hemodynamically stable.  Afebrile. Also has leukocytosis improving.  Acute kidney injury: Current baseline creatinine is unknown but his creatinine on 06/30/2017 was normal.  Continue IV fluids.  Will check BMP tomorrow. Will get ultrasound of the kidneys, urine sodium and urine protein.  Dysphagia: Complains of difficulty swallowing food.  Chest auscultation revealed crackles on the left lung base.  Will check chest x-ray. Chest x-ray on presentation did not show pneumonia and pulmonary edema.  Chronic alcoholism: Drinks half liter of hard liquor every day.  Counseled for cessation.  Will request for social worker for  rehabilitation services.  Continue thiamine folic acid.  Monitor for withdrawal.  Hypokalemia: Being supplemented.  Continue magnesium and potassium supplementation.  Thrombocytopenia: Most likely secondary to chronic alcoholism.  We will continue to monitor.  Lactic acidosis: Resolved with IV fluids  Hyperlipidemia: Continue statin  Deconditioning/debility: We will request for physical therapy evaluation.          DVT prophylaxis: Sub heparin Code Status: Full Family Communication: None present at the bedside Disposition Plan: Home after full work-up   Consultants: None  Procedures: None Antimicrobials:  Anti-infectives (From admission, onward)   Start     Dose/Rate Route Frequency Ordered Stop   11/16/18 1800  cefTRIAXone (ROCEPHIN) 1 g in sodium chloride 0.9 % 100 mL IVPB  Status:  Discontinued     1 g 200 mL/hr over 30 Minutes Intravenous Every 24 hours 11/15/18 2050 11/16/18 0759   11/16/18 1000  cefTRIAXone (ROCEPHIN) 2 g in sodium chloride 0.9 % 100 mL IVPB     2 g 200 mL/hr over 30 Minutes Intravenous Every 24 hours 11/16/18 0759     11/15/18 1730  cefTRIAXone (ROCEPHIN) 1 g in sodium chloride 0.9 % 100 mL IVPB     1 g 200 mL/hr over 30 Minutes Intravenous  Once 11/15/18 1728 11/15/18 1954      Subjective: Patient seen and examined the bedside this morning.  Hemodynamically stable today.  Afebrile.  Still complains of difficulty swallowing this morning.  Denies any chest pain today.  Objective: Vitals:   11/15/18 1917 11/15/18 2031 11/16/18 0351 11/16/18 1000  BP:  102/64 105/64   Pulse:  97 76   Resp:  16 20   Temp: (!) 100.8 F (38.2 C) 98.7 F (37.1 C) 98.6 F (37 C)   TempSrc:  Oral Oral Oral   SpO2:  98% 100%   Weight:    75.8 kg  Height:     (1.778 m)    Intake/Output Summary (Last 24 hours) at 11/16/2018 1222 Last data filed at 11/16/2018 1000 Gross per 24 hour  Intake 1360 ml  Output 375 ml  Net 985 ml   Filed Weights   11/16/18  1000  Weight: 75.8 kg    Examination:  General exam: Appears calm and comfortable ,Not in distress,average built HEENT:PERRL,Oral mucosa moist, Ear/Nose normal on gross exam Respiratory system: Crackles on the left base. Cardiovascular system: S1 & S2 heard, RRR. No JVD, murmurs, rubs, gallops or clicks. No pedal edema. Gastrointestinal system: Abdomen is mildly distended, soft and nontender. No organomegaly or masses felt. Normal bowel sounds heard. Central nervous system: Alert and oriented. No focal neurological deficits. Extremities: No edema, no clubbing ,no cyanosis, distal peripheral pulses palpable. Skin: No rashes, lesions or ulcers,no icterus ,no pallor MSK: Normal muscle bulk,tone ,power Psychiatry: Judgement and insight appear normal. Mood & affect appropriate.     Data Reviewed: I have personally reviewed following labs and imaging studies  CBC: Recent Labs  Lab 11/15/18 1314 11/16/18 0702  WBC 28.7* 17.2*  HGB 12.2* 11.0*  HCT 36.8* 33.7*  MCV 97.6 100.3*  PLT 144* 103*   Basic Metabolic Panel: Recent Labs  Lab 11/15/18 1314 11/16/18 0702  NA 135 135  K 3.0* 3.5  CL 98 103  CO2 22 20*  GLUCOSE 210* 152*  BUN 50* 58*  CREATININE 2.12* 2.41*  CALCIUM 9.0 8.2*   GFR: Estimated Creatinine Clearance: 27.3 mL/min (A) (by C-G formula based on SCr of 2.41 mg/dL (H)). Liver Function Tests: Recent Labs  Lab 11/15/18 1555 11/16/18 0702  AST 26 19  ALT 14 13  ALKPHOS 87 80  BILITOT 1.1 1.1  PROT 7.8 6.8  ALBUMIN 3.7 3.2*   Recent Labs  Lab 11/15/18 1555  LIPASE 40   No results for input(s): AMMONIA in the last 168 hours. Coagulation Profile: No results for input(s): INR, PROTIME in the last 168 hours. Cardiac Enzymes: No results for input(s): CKTOTAL, CKMB, CKMBINDEX, TROPONINI in the last 168 hours. BNP (last 3 results) No results for input(s): PROBNP in the last 8760 hours. HbA1C: No results for input(s): HGBA1C in the last 72  hours. CBG: No results for input(s): GLUCAP in the last 168 hours. Lipid Profile: No results for input(s): CHOL, HDL, LDLCALC, TRIG, CHOLHDL, LDLDIRECT in the last 72 hours. Thyroid Function Tests: No results for input(s): TSH, T4TOTAL, FREET4, T3FREE, THYROIDAB in the last 72 hours. Anemia Panel: No results for input(s): VITAMINB12, FOLATE, FERRITIN, TIBC, IRON, RETICCTPCT in the last 72 hours. Sepsis Labs: Recent Labs  Lab 11/15/18 1555 11/15/18 1720  LATICACIDVEN 2.8* 1.8    Recent Results (from the past 240 hour(s))  Blood culture (routine x 2)     Status: None (Preliminary result)   Collection Time: 11/15/18  3:53 PM  Result Value Ref Range Status   Specimen Description   Final    BLOOD RIGHT WRIST Performed at [facility], [address], [place], [place] [address]    Special Requests   Final    BOTTLES DRAWN AEROBIC AND ANAEROBIC Blood Culture results may not be optimal due to an inadequate volume of blood received in culture bottles Performed at [facility], [address] [place], [place] [address]    Culture  Setup Time   Final  GRAM NEGATIVE RODS IN BOTH AEROBIC AND ANAEROBIC BOTTLES Organism ID to follow CRITICAL RESULT CALLED TO, READ BACK BY AND VERIFIED WITH: PHRMD N GLOGOVAC @0843  11/16/2018 BY S GEZAHEGN Performed at [facility], [address] [place], [place] [address]    Culture GRAM NEGATIVE RODS  Final   Report Status PENDING  Incomplete  Blood Culture ID Panel (Reflexed)     Status: Abnormal   Collection Time: 11/15/18  3:53 PM  Result Value Ref Range Status   Enterococcus species NOT DETECTED NOT DETECTED Final   Listeria monocytogenes NOT DETECTED NOT DETECTED Final   Staphylococcus species NOT DETECTED NOT DETECTED Final   Staphylococcus aureus (BCID) NOT DETECTED NOT DETECTED Final   Streptococcus species NOT DETECTED NOT DETECTED Final   Streptococcus agalactiae NOT DETECTED NOT DETECTED Final    Streptococcus pneumoniae NOT DETECTED NOT DETECTED Final   Streptococcus pyogenes NOT DETECTED NOT DETECTED Final   Acinetobacter baumannii NOT DETECTED NOT DETECTED Final   Enterobacteriaceae species DETECTED (A) NOT DETECTED Final    Comment: Enterobacteriaceae represent a large family of gram-negative bacteria, not a single organism. CRITICAL RESULT CALLED TO, READ BACK BY AND VERIFIED WITH: PHRM N GLOGOVAC @0843  11/16/2018 BY S GEZAHEGN    Enterobacter cloacae complex NOT DETECTED NOT DETECTED Final   Escherichia coli DETECTED (A) NOT DETECTED Final    Comment: CRITICAL RESULT CALLED TO, READ BACK BY AND VERIFIED WITH: PHRM N GLOGOVAC @0843  11/16/2018 BY S GEZAHEGN    Klebsiella oxytoca NOT DETECTED NOT DETECTED Final   Klebsiella pneumoniae NOT DETECTED NOT DETECTED Final   Proteus species NOT DETECTED NOT DETECTED Final   Serratia marcescens NOT DETECTED NOT DETECTED Final   Carbapenem resistance NOT DETECTED NOT DETECTED Final   Haemophilus influenzae NOT DETECTED NOT DETECTED Final   Neisseria meningitidis NOT DETECTED NOT DETECTED Final   Pseudomonas aeruginosa NOT DETECTED NOT DETECTED Final   Candida albicans NOT DETECTED NOT DETECTED Final   Candida glabrata NOT DETECTED NOT DETECTED Final   Candida krusei NOT DETECTED NOT DETECTED Final   Candida parapsilosis NOT DETECTED NOT DETECTED Final   Candida tropicalis NOT DETECTED NOT DETECTED Final    Comment: Performed at [facility], [address], [place], [place] [address]  Blood culture (routine x 2)     Status: None (Preliminary result)   Collection Time: 11/15/18  3:55 PM  Result Value Ref Range Status   Specimen Description   Final    BLOOD LEFT ANTECUBITAL Performed at [facility], [address], [place], [place] [address]    Special Requests   Final    BOTTLES DRAWN AEROBIC AND ANAEROBIC Blood Culture adequate volume Performed at [facility], [address]  [place], [place] [address]    Culture  Setup Time   Final    GRAM NEGATIVE RODS IN BOTH AEROBIC AND ANAEROBIC BOTTLES CRITICAL VALUE NOTED.  VALUE IS CONSISTENT WITH PREVIOUSLY REPORTED AND CALLED VALUE. Performed at [facility], [address], [place], [place] [address]    Culture GRAM NEGATIVE RODS  Final   Report Status PENDING  Incomplete  Group A Strep by PCR     Status: None   Collection Time: 11/15/18  5:10 PM  Result Value Ref Range Status   Group A Strep by PCR NOT DETECTED NOT DETECTED Final    Comment: Performed at [facility], [address], [place], [place] [address]  Radiology Studies: Dg Chest 2 View  Result Date: 11/15/2018 CLINICAL DATA:  Chest pain and dizziness EXAM: CHEST - 2 VIEW COMPARISON:  05/09/2017 FINDINGS: Thoracic aortic calcifications are again seen and stable. Cardiac shadow is within normal limits. No focal infiltrate or sizable effusion is seen. No acute bony abnormality is noted. IMPRESSION: No acute abnormality noted. Electronically Signed   By: Alcide Clever M.D.   On: 11/15/2018 14:15        Scheduled Meds: . aspirin EC  81 mg Oral Daily  . cholecalciferol  2,000 Units Oral Daily  . dorzolamide-timolol  1 drop Right Eye BID  . heparin  5,000 Units Subcutaneous Q8H  . latanoprost  1 drop Both Eyes QHS  . magnesium oxide  400 mg Oral Daily  . potassium chloride  40 mEq Oral Once  . thiamine  100 mg Oral Daily  . vitamin B-12  1,000 mcg Oral Daily   Continuous Infusions: . sodium chloride    . cefTRIAXone (ROCEPHIN)  IV 2 g (11/16/18 0902)     LOS: 1 day    Time spent: 35 mins.More than 50% of that time was spent in counseling and/or coordination of care.      Burnadette Pop, MD [facility] Pager [phone]  If 7PM-7AM, please contact night-coverage [email or web address] Password [facility] 11/16/2018, 12:22 PM

## 2018-11-16 NOTE — Evaluation (Addendum)
Physical Therapy Evaluation Patient Details Name: Tommy Jensen MRN: [id number] DOB: 1941-01-02 Today's Date: 11/16/2018   History of Present Illness  Patient is a 78 y/o male presenting to the ED on 11/15/2018 with primary complaints of chest pain and sore throat. Past medical history significant of alcohol abuse, anxiety disorder, GERD, hyperlipidemia, hypertension, seizure disorder, hyperlipidemia. Admitted for UTI.     Clinical Impression  Patient admitted with the above listed diagnosis. Patient reports IND with mobility and ADLs, however began to notice increasing weakness prior to admission with reported fall on steps at home. Patient today requiring min guard throughout session for safety - patient impulsive at times requiring cueing for safety and stability. Anticipate patient will progress well and not require PT services at discharge. Will plan to assess stair navigation and gait without AD at next visit. PT to follow acutely.      Follow Up Recommendations No PT follow up;Supervision - Intermittent    Equipment Recommendations  None recommended by PT    Recommendations for Other Services       Precautions / Restrictions Precautions Precautions: Fall Restrictions Weight Bearing Restrictions: No      Mobility  Bed Mobility Overal bed mobility: Modified Independent                Transfers Overall transfer level: Needs assistance Equipment used: Rolling walker (2 wheeled) Transfers: Sit to/from [employer] Transfers Sit to Stand: Min guard;Supervision Stand pivot transfers: Min guard;Supervision       General transfer comment: min guard/supervision for safety; impulsive throughout with cueing to slow movements to prevent fall  Ambulation/Gait Ambulation/Gait assistance: Min guard Gait Distance (Feet): 200 Feet Assistive device: Rolling walker (2 wheeled) Gait Pattern/deviations: Step-through pattern;Drifts right/left Gait velocity: varying    General Gait Details: mild instability with RW - at times letting go of walker with one hand  Stairs            Wheelchair Mobility    Modified Rankin (Stroke Patients Only)       Balance Overall balance assessment: Needs assistance Sitting-balance support: No upper extremity supported;Feet supported Sitting balance-Leahy Scale: Good     Standing balance support: Single extremity supported;Bilateral upper extremity supported;No upper extremity supported;During functional activity Standing balance-Leahy Scale: Fair Standing balance comment: mild instability - impulsive                             Pertinent Vitals/Pain Pain Assessment: No/denies pain    Home Living Family/patient expects to be discharged to:: Private residence Living Arrangements: Alone Available Help at Discharge: Family;Friend(s);Available PRN/intermittently Type of Home: House Home Access: Stairs to enter Entrance Stairs-Rails: None Entrance Stairs-Number of Steps: 1 Home Layout: Two level Home Equipment: None      Prior Function Level of Independence: Independent               Hand Dominance        Extremity/Trunk Assessment   Upper Extremity Assessment Upper Extremity Assessment: Defer to OT evaluation    Lower Extremity Assessment Lower Extremity Assessment: Overall WFL for tasks assessed    Cervical / Trunk Assessment Cervical / Trunk Assessment: Normal  Communication   Communication: No difficulties  Cognition Arousal/Alertness: Awake/alert Behavior During Therapy: WFL for tasks assessed/performed Overall Cognitive Status: Within Functional Limits for tasks assessed  General Comments: mildly impulsive      General Comments General comments (skin integrity, edema, etc.): Has a daughter that lives 3 hrs away    Exercises     Assessment/Plan    PT Assessment Patient needs continued PT services  PT  Problem List Decreased strength;Decreased activity tolerance;Decreased balance;Decreased mobility;Decreased knowledge of use of DME;Decreased safety awareness       PT Treatment Interventions DME instruction;Gait training;Stair training;Functional mobility training;Therapeutic activities;Therapeutic exercise;Balance training;Patient/family education    PT Goals (Current goals can be found in the Care Plan section)  Acute Rehab PT Goals Patient Stated Goal: return home  PT Goal Formulation: With patient Time For Goal Achievement: 11/30/18 Potential to Achieve Goals: Good    Frequency Min 3X/week   Barriers to discharge        Co-evaluation               AM-PAC PT "6 Clicks" Mobility  Outcome Measure Help needed turning from your back to your side while in a flat bed without using bedrails?: A Little Help needed moving from lying on your back to sitting on the side of a flat bed without using bedrails?: A Little Help needed moving to and from a bed to a chair (including a wheelchair)?: A Little Help needed standing up from a chair using your arms (e.g., wheelchair or bedside chair)?: A Little Help needed to walk in hospital room?: A Little Help needed climbing 3-5 steps with a railing? : A Little 6 Click Score: 18    End of Session Equipment Utilized During Treatment: Gait belt Activity Tolerance: Patient tolerated treatment well Patient left: in bed;with call bell/phone within reach Nurse Communication: Mobility status PT Visit Diagnosis: Unsteadiness on feet (R26.81);Other abnormalities of gait and mobility (R26.89);Muscle weakness (generalized) (M62.81)    Time: 0211-1552 PT Time Calculation (min) (ACUTE ONLY): 19 min   Charges:   PT Evaluation $PT Eval Moderate Complexity: 1 Mod          Kipp Laurence, PT, DPT Supplemental Physical Therapist 11/16/18 3:00 PM Pager: [phone] Office: [phone]
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
## 2018-11-17 LAB — CBC WITH DIFFERENTIAL/PLATELET
Abs Immature Granulocytes: 0.27 10*3/uL — ABNORMAL HIGH (ref 0.00–0.07)
BASOS PCT: 0 %
Basophils Absolute: 0.1 10*3/uL (ref 0.0–0.1)
Eosinophils Absolute: 0.1 10*3/uL (ref 0.0–0.5)
Eosinophils Relative: 0 %
HEMATOCRIT: 31.8 % — AB (ref 39.0–52.0)
Hemoglobin: 10.2 g/dL — ABNORMAL LOW (ref 13.0–17.0)
Immature Granulocytes: 2 %
Lymphocytes Relative: 5 %
Lymphs Abs: 0.8 10*3/uL (ref 0.7–4.0)
MCH: 32.1 pg (ref 26.0–34.0)
MCHC: 32.1 g/dL (ref 30.0–36.0)
MCV: 100 fL (ref 80.0–100.0)
MONOS PCT: 8 %
Monocytes Absolute: 1.2 10*3/uL — ABNORMAL HIGH (ref 0.1–1.0)
Neutro Abs: 13.7 10*3/uL — ABNORMAL HIGH (ref 1.7–7.7)
Neutrophils Relative %: 85 %
Platelets: 102 10*3/uL — ABNORMAL LOW (ref 150–400)
RBC: 3.18 MIL/uL — ABNORMAL LOW (ref 4.22–5.81)
RDW: 15.8 % — AB (ref 11.5–15.5)
WBC: 16 10*3/uL — ABNORMAL HIGH (ref 4.0–10.5)
nRBC: 0 % (ref 0.0–0.2)

## 2018-11-17 LAB — BASIC METABOLIC PANEL
Anion gap: 9 (ref 5–15)
BUN: 51 mg/dL — ABNORMAL HIGH (ref 8–23)
CO2: 18 mmol/L — ABNORMAL LOW (ref 22–32)
Calcium: 7.9 mg/dL — ABNORMAL LOW (ref 8.9–10.3)
Chloride: 110 mmol/L (ref 98–111)
Creatinine, Ser: 1.89 mg/dL — ABNORMAL HIGH (ref 0.61–1.24)
GFR calc Af Amer: 39 mL/min — ABNORMAL LOW (ref >60)
GFR calc non Af Amer: 34 mL/min — ABNORMAL LOW (ref >60)
Glucose, Bld: 119 mg/dL — ABNORMAL HIGH (ref 70–99)
POTASSIUM: 3.6 mmol/L (ref 3.5–5.1)
Sodium: 137 mmol/L (ref 135–145)

## 2018-11-17 MED ORDER — PANTOPRAZOLE SODIUM 40 MG PO TBEC
40.0000 mg | DELAYED_RELEASE_TABLET | Freq: Every day | ORAL | Status: DC
  Administered 2018-11-17 – 2018-11-18 (×2): 40 mg via ORAL
  Filled 2018-11-17 (×2): qty 1

## 2018-11-17 MED ORDER — POTASSIUM CHLORIDE CRYS ER 20 MEQ PO TBCR
40.0000 meq | EXTENDED_RELEASE_TABLET | Freq: Once | ORAL | Status: AC
  Administered 2018-11-17: 40 meq via ORAL
  Filled 2018-11-17: qty 2

## 2018-11-17 NOTE — Progress Notes (Signed)
Physical Therapy Treatment Patient Details Name: Tommy Jensen MRN: [id number] DOB: [birth year] Today's Date: 11/17/2018    History of Present Illness Patient is a 78 y/o male presenting to the ED on 11/15/2018 with primary complaints of chest pain and sore throat. Past medical history significant of alcohol abuse, anxiety disorder, GERD, hyperlipidemia, hypertension, seizure disorder, hyperlipidemia. Admitted for UTI.     PT Comments    Patient progressing to ambulating without device this session and able to negotiate simulated step in hallway without LOB.  Remains somewhat at risk for falls, but educated regarding ways to decrease fall risk and on energy conservation techniques.  Patient verbalized understanding.  Will attempt to practice flight of stairs when IV disconnected prior to PT.   Follow Up Recommendations  No PT follow up;Supervision - Intermittent     Equipment Recommendations  None recommended by PT    Recommendations for Other Services       Precautions / Restrictions Precautions Precautions: Fall    Mobility  Bed Mobility               General bed mobility comments: up sitting EOB after talking with SW  Transfers Overall transfer level: Independent   Transfers: Sit to/from Stand Sit to Stand: Independent            Ambulation/Gait Ambulation/Gait assistance: Supervision Gait Distance (Feet): 250 Feet Assistive device: None Gait Pattern/deviations: Step-through pattern;Drifts right/left     General Gait Details: without device demonstrated stability in hallway, somewhat difficult in room maneuvering with IV pole with some impulsivity, but noted to be backing up and turning without LOB holding IV pole.  In hallway some minor drifting noted when turning head for environmental scanning    Stairs             Wheelchair Mobility    Modified Rankin (Stroke Patients Only)       Balance                                  Standardized Balance Assessment Standardized Balance Assessment : Dynamic Gait Index   Dynamic Gait Index Level Surface: Normal Gait with Horizontal Head Turns: Mild Impairment Gait and Pivot Turn: Normal Step Over Obstacle: Normal Step Around Obstacles: Mild Impairment      Cognition Arousal/Alertness: Awake/alert Behavior During Therapy: WFL for tasks assessed/performed Overall Cognitive Status: Within Functional Limits for tasks assessed                                 General Comments: mildly impulsive      Exercises      General Comments General comments (skin integrity, edema, etc.): Educated in fall prevention tips including footwear, lighting, clear pathway to bathroom, keeping items frequently used stored within easy/safe reach.      Pertinent Vitals/Pain Pain Assessment: No/denies pain    Home Living                      Prior Function            PT Goals (current goals can now be found in the care plan section) Progress towards PT goals: Goals met/education completed, patient discharged from PT    Frequency    Min 3X/week      PT Plan Current plan remains appropriate    Co-evaluation  AM-PAC PT "6 Clicks" Mobility   Outcome Measure  Help needed turning from your back to your side while in a flat bed without using bedrails?: None Help needed moving from lying on your back to sitting on the side of a flat bed without using bedrails?: None Help needed moving to and from a bed to a chair (including a wheelchair)?: None Help needed standing up from a chair using your arms (e.g., wheelchair or bedside chair)?: None Help needed to walk in hospital room?: None Help needed climbing 3-5 steps with a railing? : A Little 6 Click Score: 23    End of Session   Activity Tolerance: Patient tolerated treatment well Patient left: in bed(seated EOB)   PT Visit Diagnosis: Unsteadiness on feet (R26.81);Other  abnormalities of gait and mobility (R26.89);Muscle weakness (generalized) (M62.81)     Time: 1120-1140 PT Time Calculation (min) (ACUTE ONLY): 20 min  Charges:  $Gait Training: 8-22 mins                     Magda Kiel, [place] [phone] 11/17/2018    Reginia Naas 11/17/2018, 12:17 PM

## 2018-11-17 NOTE — Progress Notes (Signed)
Patient had a 12 beat run of SVT.  Checked on patient, and he did not currently show any changes.  Patient was up and moving around at the time.

## 2018-11-17 NOTE — Progress Notes (Signed)
Received call from lab, Patient's urine protein is 255.

## 2018-11-17 NOTE — Clinical Social Work Note (Signed)
Clinical Social Work Assessment  Patient Details  Name: Tommy Jensen MRN: [id number] Date of Birth: 05/13/1941  Date of referral:  11/17/18               Reason for consult:  Substance Use/ETOH Abuse                Permission sought to share information with:    Permission granted to share information::  No  Name::        Agency::     Relationship::     Contact Information:     Housing/Transportation Living arrangements for the past 2 months:  Single Family Home Source of Information:  Patient Patient Interpreter Needed:  None Criminal Activity/Legal Involvement Pertinent to Current Situation/Hospitalization:  No - Comment as needed Significant Relationships:  None Lives with:  Self Do you feel safe going back to the place where you live?  Yes Need for family participation in patient care:  Yes (Comment)  Care giving concerns:  Patient lives alone. Patient reports drinking alcohol to cope with stress.    Social Worker assessment / plan:  LCSW consulted for [employer].   LCSW met at bedside with patient. Patient friend is present. LCSW asked for permission to speak about resources. Patient friend left the room.   Patient reports that he stopped drinking alcohol on his own about a year ago. Shortly after he had a seizure and was hospitalized and dc to SNF for rehab for a few months. Patient reports that he states drinking heavily again about a month ago when things got stressful. Patient states that he drinks a half liter of liquor daily.    Patient reports that he has no family and about 4-5 friends in the area that he does not see often. Patient has children listed as contacts on face sheet and children where involved in past dc planning per chart. Patient did not give LCSW permission to contact children.   PLAN: LCSW provided patient with SA resources. Patient will follow up with resources at dc.   Employment status:  Retired Nurse, adult PT  Recommendations:  Not assessed at this time Information / Referral to community resources:     Patient/Family's Response to care:  Patient thankful for [employer]   Patient/Family's Understanding of and Emotional Response to Diagnosis, Current Treatment, and Prognosis:  Patient is understanding of his current condition. Patient dicussed the effects alcohol has taken on his health. Patient states he is motivated to stop drinking. Patient states that he has been to [place] before and was not impressed.   Emotional Assessment Appearance:  Appears stated age Attitude/Demeanor/Rapport:  Engaged Affect (typically observed):  Pleasant Orientation:  Oriented to Self, Oriented to Place, Oriented to  Time, Oriented to Situation Alcohol / Substance use:  Not Applicable Psych involvement (Current and /or in the community):  No (Comment)  Discharge Needs  Concerns to be addressed:  Coping/Stress Concerns Readmission within the last 30 days:  No Current discharge risk:  None Barriers to Discharge:  No Barriers Identified   Servando Snare, LCSW 11/17/2018, 11:38 AM

## 2018-11-17 NOTE — Progress Notes (Signed)
PROGRESS NOTE    Tommy Jensen  GEZ:[id number] DOB: Aug 12, 1941 DOA: 11/15/2018 PCP: Georgianne Fick, MD   Brief Narrative: Patient is a 77-year male with history of chronic alcohol abuse, anxiety, and GERD, hyperlipidemia, hypertension, seizure disorder not on seizure meds, hyperlipidemia who presents to the emergency department with nonspecific complaints including chest pain, sore throat, generalized malaise, decreased energy, increased frequency of urination and mild dysuria.  Patient was found to have mild fever on presentation with elevated white cell counts.  Urine was suggestive of urine tract infection.  Also found to have acute kidney injury.  Currently has been managed for gram-negative bacteremia secondary urinary tract infection.  Assessment & Plan:   Principal Problem:   UTI (urinary tract infection) Active Problems:   Hypercholesteremia   Essential hypertension   Hypokalemia   Alcoholism (HCC)   Thrombocytopenia (HCC)   Leucocytosis   Lactic acidosis   ARF (acute renal failure) (HCC)   Gram-negative bacteremia/sepsis/urinary tract infection: Presented with fever, increased frequency and dysuria.  Urinalysis suggestive of urinary tract infection.  Blood cultures showing gram-negative rods.  Continue ceftriaxone 2 g IV daily.  Currently is hemodynamically stable.  Afebrile. Also has leukocytosis which is  improving.  Acute kidney injury: Improving.Current baseline creatinine is unknown but his creatinine on 06/30/2017 was normal.  Continue IV fluids.  Will check BMP tomorrow. Normal ultrasound of the kidneys.Urine sodium is 19 suggesting prerenal AKI.  Dysphagia: Complains of difficulty swallowing food,mainly odynophagia.  Has been improving since hospitalization.  Speech therapy following.  Chest x-ray on presentation did not show pneumonia and pulmonary edema.  Chronic alcoholism: Drinks half liter of hard liquor every day.  Counseled for cessation.  Social worker  facilitated  rehabilitation services.  Continue thiamine .folic acid.  Monitor for withdrawal.  Hypokalemia:  Continue magnesium .potassium supplemented.  Thrombocytopenia: Most likely secondary to chronic alcoholism.  We will continue to monitor.  Lactic acidosis: Resolved with IV fluids  Hyperlipidemia: Continue statin  Deconditioning/debility: Physical therapy evaluated the patient.  Did not recommend anything specific.          DVT prophylaxis: Sub heparin Code Status: Full Family Communication: None present at the bedside Disposition Plan: Home likely tomorrow.  Awaiting blood culture report   Consultants: None  Procedures: None Antimicrobials:  Anti-infectives (From admission, onward)   Start     Dose/Rate Route Frequency Ordered Stop   11/16/18 1800  cefTRIAXone (ROCEPHIN) 1 g in sodium chloride 0.9 % 100 mL IVPB  Status:  Discontinued     1 g 200 mL/hr over 30 Minutes Intravenous Every 24 hours 11/15/18 2050 11/16/18 0759   11/16/18 1000  cefTRIAXone (ROCEPHIN) 2 g in sodium chloride 0.9 % 100 mL IVPB     2 g 200 mL/hr over 30 Minutes Intravenous Every 24 hours 11/16/18 0759     11/15/18 1730  cefTRIAXone (ROCEPHIN) 1 g in sodium chloride 0.9 % 100 mL IVPB     1 g 200 mL/hr over 30 Minutes Intravenous  Once 11/15/18 1728 11/15/18 1954      Subjective: Patient seen and examined the bedside this morning.  Remains comfortable.  Hemodynamically stable.  No new issues.  Odynophagia has improved Objective: Vitals:   11/16/18 1000 11/16/18 1419 11/16/18 1945 11/17/18 0540  BP:  121/82 137/82 (!) 142/87  Pulse:  79 (!) 101 89  Resp:  16 20 20   Temp:  98.1 F (36.7 C) 98.8 F (37.1 C) 98.6 F (37 C)  TempSrc:  Oral Oral  Oral  SpO2:  97% 97% 97%  Weight: 75.8 kg     Height: 5\' 10"  (1.778 m)       Intake/Output Summary (Last 24 hours) at 11/17/2018 1204 Last data filed at 11/17/2018 0300 Gross per 24 hour  Intake 1335.03 ml  Output 300 ml  Net 1035.03 ml    Filed Weights   11/16/18 1000  Weight: 75.8 kg    Examination:  General exam: Appears calm and comfortable ,Not in distress,average built HEENT:PERRL,Oral mucosa moist, Ear/Nose normal on gross exam Respiratory system: Bilateral equal air entry, normal vesicular breath sounds, no wheezes or crackles  Cardiovascular system: S1 & S2 heard, RRR. No JVD, murmurs, rubs, gallops or clicks. Gastrointestinal system: Abdomen is nondistended, soft and nontender. No organomegaly or masses felt. Normal bowel sounds heard. Central nervous system: Alert and oriented. No focal neurological deficits. Extremities: No edema, no clubbing ,no cyanosis, distal peripheral pulses palpable. Skin: No rashes, lesions or ulcers,no icterus ,no pallor MSK: Normal muscle bulk,tone ,power Psychiatry: Judgement and insight appear normal. Mood & affect appropriate.      Data Reviewed: I have personally reviewed following labs and imaging studies  CBC: Recent Labs  Lab 11/15/18 1314 11/16/18 0702 11/17/18 0614  WBC 28.7* 17.2* 16.0*  NEUTROABS  --   --  13.7*  HGB 12.2* 11.0* 10.2*  HCT 36.8* 33.7* 31.8*  MCV 97.6 100.3* 100.0  PLT 144* 103* 102*   Basic Metabolic Panel: Recent Labs  Lab 11/15/18 1314 11/16/18 0702 11/17/18 0614  NA 135 135 137  K 3.0* 3.5 3.6  CL 98 103 110  CO2 22 20* 18*  GLUCOSE 210* 152* 119*  BUN 50* 58* 51*  CREATININE 2.12* 2.41* 1.89*  CALCIUM 9.0 8.2* 7.9*   GFR: Estimated Creatinine Clearance: 34.9 mL/min (A) (by C-G formula based on SCr of 1.89 mg/dL (H)). Liver Function Tests: Recent Labs  Lab 11/15/18 1555 11/16/18 0702  AST 26 19  ALT 14 13  ALKPHOS 87 80  BILITOT 1.1 1.1  PROT 7.8 6.8  ALBUMIN 3.7 3.2*   Recent Labs  Lab 11/15/18 1555  LIPASE 40   No results for input(s): AMMONIA in the last 168 hours. Coagulation Profile: No results for input(s): INR, PROTIME in the last 168 hours. Cardiac Enzymes: No results for input(s): CKTOTAL, CKMB,  CKMBINDEX, TROPONINI in the last 168 hours. BNP (last 3 results) No results for input(s): PROBNP in the last 8760 hours. HbA1C: No results for input(s): HGBA1C in the last 72 hours. CBG: No results for input(s): GLUCAP in the last 168 hours. Lipid Profile: No results for input(s): CHOL, HDL, LDLCALC, TRIG, CHOLHDL, LDLDIRECT in the last 72 hours. Thyroid Function Tests: No results for input(s): TSH, T4TOTAL, FREET4, T3FREE, THYROIDAB in the last 72 hours. Anemia Panel: No results for input(s): VITAMINB12, FOLATE, FERRITIN, TIBC, IRON, RETICCTPCT in the last 72 hours. Sepsis Labs: Recent Labs  Lab 11/15/18 1555 11/15/18 1720  LATICACIDVEN 2.8* 1.8    Recent Results (from the past 240 hour(s))  Urine culture     Status: Abnormal (Preliminary result)   Collection Time: 11/15/18  3:53 PM  Result Value Ref Range Status   Specimen Description   Final    URINE, RANDOM Performed at [facility], [address] [place], [place] [address]    Special Requests   Final    NONE Performed at [facility], [address] [place], [place] [address]    Culture >=100,000 COLONIES/mL ESCHERICHIA COLI (  A)  Final   Report Status PENDING  Incomplete  Blood culture (routine x 2)     Status: Abnormal (Preliminary result)   Collection Time: 11/15/18  3:53 PM  Result Value Ref Range Status   Specimen Description   Final    BLOOD RIGHT WRIST Performed at [facility], [address], [place], [place] [address]    Special Requests   Final    BOTTLES DRAWN AEROBIC AND ANAEROBIC Blood Culture results may not be optimal due to an inadequate volume of blood received in culture bottles Performed at [facility], [address], [place], [place] [address]    Culture  Setup Time   Final    GRAM NEGATIVE RODS IN BOTH AEROBIC AND ANAEROBIC BOTTLES CRITICAL RESULT CALLED TO, READ BACK BY AND VERIFIED WITH: PHRMD N GLOGOVAC   11/16/2018 BY S GEZAHEGN    Culture (A)  Final    ESCHERICHIA COLI SUSCEPTIBILITIES TO FOLLOW Performed at [facility], [address], [place], [place] [address]    Report Status PENDING  Incomplete  Blood Culture ID Panel (Reflexed)     Status: Abnormal   Collection Time: 11/15/18  3:53 PM  Result Value Ref Range Status   Enterococcus species NOT DETECTED NOT DETECTED Final   Listeria monocytogenes NOT DETECTED NOT DETECTED Final   Staphylococcus species NOT DETECTED NOT DETECTED Final   Staphylococcus aureus (BCID) NOT DETECTED NOT DETECTED Final   Streptococcus species NOT DETECTED NOT DETECTED Final   Streptococcus agalactiae NOT DETECTED NOT DETECTED Final   Streptococcus pneumoniae NOT DETECTED NOT DETECTED Final   Streptococcus pyogenes NOT DETECTED NOT DETECTED Final   Acinetobacter baumannii NOT DETECTED NOT DETECTED Final   Enterobacteriaceae species DETECTED (A) NOT DETECTED Final    Comment: Enterobacteriaceae represent a large family of gram-negative bacteria, not a single organism. CRITICAL RESULT CALLED TO, READ BACK BY AND VERIFIED WITH: PHRM N GLOGOVAC  11/16/2018 BY S GEZAHEGN    Enterobacter cloacae complex NOT DETECTED NOT DETECTED Final   Escherichia coli DETECTED (A) NOT DETECTED Final    Comment: CRITICAL RESULT CALLED TO, READ BACK BY AND VERIFIED WITH: PHRM N GLOGOVAC  11/16/2018 BY S GEZAHEGN    Klebsiella oxytoca NOT DETECTED NOT DETECTED Final   Klebsiella pneumoniae NOT DETECTED NOT DETECTED Final   Proteus species NOT DETECTED NOT DETECTED Final   Serratia marcescens NOT DETECTED NOT DETECTED Final   Carbapenem resistance NOT DETECTED NOT DETECTED Final   Haemophilus influenzae NOT DETECTED NOT DETECTED Final   Neisseria meningitidis NOT DETECTED NOT DETECTED Final   Pseudomonas aeruginosa NOT DETECTED NOT DETECTED Final   Candida albicans NOT DETECTED NOT DETECTED Final   Candida glabrata NOT DETECTED NOT DETECTED Final   Candida  krusei NOT DETECTED NOT DETECTED Final   Candida parapsilosis NOT DETECTED NOT DETECTED Final   Candida tropicalis NOT DETECTED NOT DETECTED Final    Comment: Performed at [facility], [address] [place], [place] [address]  Blood culture (routine x 2)     Status: None (Preliminary result)   Collection Time: 11/15/18  3:55 PM  Result Value Ref Range Status   Specimen Description   Final    BLOOD LEFT ANTECUBITAL Performed at [facility], [address] [place], [place] [address]    Special Requests   Final    BOTTLES DRAWN AEROBIC AND ANAEROBIC Blood Culture adequate volume Performed at [facility], [address], [place], [place] [address]    Culture  Setup Time   Final    GRAM NEGATIVE RODS IN BOTH AEROBIC AND ANAEROBIC BOTTLES CRITICAL VALUE NOTED.  VALUE IS CONSISTENT WITH PREVIOUSLY REPORTED AND CALLED VALUE. Performed at [facility], [address], [place], [place] [address]    Culture GRAM NEGATIVE RODS  Final   Report Status PENDING  Incomplete  Group A Strep by PCR     Status: None   Collection Time: 11/15/18  5:10 PM  Result Value Ref Range Status   Group A Strep by PCR NOT DETECTED NOT DETECTED Final    Comment: Performed at [facility], [address], [place], [place] [address]  Urine culture     Status: Abnormal (Preliminary result)   Collection Time: 11/16/18  5:34 AM  Result Value Ref Range Status   Specimen Description   Final    URINE, CLEAN CATCH Performed at [facility], [address] [place], [place] [address]    Special Requests   Final    NONE Performed at [facility], [address], [place], [place] [address]    Culture (A)  Final    >=100,000 COLONIES/mL ESCHERICHIA COLI SUSCEPTIBILITIES TO FOLLOW Performed at [facility], [address], [place], [place] [address]    Report Status PENDING  Incomplete          Radiology Studies: Dg Chest 2 View  Result Date: 11/15/2018 CLINICAL DATA:  Chest pain and dizziness EXAM: CHEST - 2 VIEW COMPARISON:  05/09/2017 FINDINGS: Thoracic aortic calcifications are again seen and stable. Cardiac shadow is within normal limits. No focal infiltrate or sizable effusion is seen. No acute bony abnormality is noted. IMPRESSION: No acute abnormality noted. Electronically Signed   By: Alcide Clever M.D.   On: 11/15/2018 14:15   US Renal  Result Date: 11/16/2018 CLINICAL DATA:  Acute kidney injury EXAM: RENAL / URINARY TRACT ULTRASOUND COMPLETE COMPARISON:  13.1 x 6.6 x 6.4 cm FINDINGS: Right Kidney: Renal measurements: 13.1 x 6.6 x 6.4 cm. = volume: 287 mL. Normal cortical thickness. Upper normal cortical echogenicity. No mass, hydronephrosis or shadowing calcification. Left Kidney: Renal measurements: 13.4 x 6.6 x 5.6 cm = volume: 261 mL. Normal cortical thickness. Upper normal cortical echogenicity. No mass, hydronephrosis or shadowing calcification. Bladder: Partially distended, grossly unremarkable. IMPRESSION: Negative renal ultrasound. Electronically Signed   By: Ulyses Southward M.D.   On: 11/16/2018 17:30        Scheduled Meds: . aspirin EC  81 mg Oral Daily  . cholecalciferol  2,000 Units Oral Daily  . dorzolamide-timolol  1 drop Right Eye BID  . folic acid  1 mg Oral Daily  . heparin  5,000 Units Subcutaneous Q8H  . latanoprost  1 drop Both Eyes QHS  . magnesium oxide  400 mg Oral Daily  . pantoprazole  40 mg Oral Daily  . thiamine  100 mg Oral Daily  . vitamin B-12  1,000 mcg Oral Daily   Continuous Infusions: . sodium chloride 100 mL/hr at 11/17/18 1108  . cefTRIAXone (ROCEPHIN)  IV 2 g (11/17/18 1107)     LOS: 2 days    Time spent: 25 mins.More than 50% of that time was spent in counseling and/or coordination of care.      Burnadette Pop, MD [facility] Pager [phone]  If 7PM-7AM, please contact  night-coverage [email or web address] Password [facility] 11/17/2018, 12:04 PM

## 2018-11-18 LAB — CBC WITH DIFFERENTIAL/PLATELET
Abs Immature Granulocytes: 0.32 10*3/uL — ABNORMAL HIGH (ref 0.00–0.07)
BASOS ABS: 0.1 10*3/uL (ref 0.0–0.1)
Basophils Relative: 0 %
EOS PCT: 1 %
Eosinophils Absolute: 0.1 10*3/uL (ref 0.0–0.5)
HCT: 33.4 % — ABNORMAL LOW (ref 39.0–52.0)
Hemoglobin: 10.9 g/dL — ABNORMAL LOW (ref 13.0–17.0)
Immature Granulocytes: 2 %
Lymphocytes Relative: 8 %
Lymphs Abs: 1.1 10*3/uL (ref 0.7–4.0)
MCH: 32.6 pg (ref 26.0–34.0)
MCHC: 32.6 g/dL (ref 30.0–36.0)
MCV: 100 fL (ref 80.0–100.0)
Monocytes Absolute: 2.1 10*3/uL — ABNORMAL HIGH (ref 0.1–1.0)
Monocytes Relative: 15 %
Neutro Abs: 10.5 10*3/uL — ABNORMAL HIGH (ref 1.7–7.7)
Neutrophils Relative %: 74 %
Platelets: 126 10*3/uL — ABNORMAL LOW (ref 150–400)
RBC: 3.34 MIL/uL — ABNORMAL LOW (ref 4.22–5.81)
RDW: 16.2 % — ABNORMAL HIGH (ref 11.5–15.5)
WBC: 14.2 10*3/uL — ABNORMAL HIGH (ref 4.0–10.5)
nRBC: 0 % (ref 0.0–0.2)

## 2018-11-18 LAB — CULTURE, BLOOD (ROUTINE X 2): Special Requests: ADEQUATE

## 2018-11-18 LAB — BASIC METABOLIC PANEL
Anion gap: 6 (ref 5–15)
BUN: 39 mg/dL — ABNORMAL HIGH (ref 8–23)
CO2: 20 mmol/L — ABNORMAL LOW (ref 22–32)
Calcium: 8.3 mg/dL — ABNORMAL LOW (ref 8.9–10.3)
Chloride: 111 mmol/L (ref 98–111)
Creatinine, Ser: 1.74 mg/dL — ABNORMAL HIGH (ref 0.61–1.24)
GFR calc Af Amer: 43 mL/min — ABNORMAL LOW (ref >60)
GFR calc non Af Amer: 38 mL/min — ABNORMAL LOW (ref >60)
Glucose, Bld: 126 mg/dL — ABNORMAL HIGH (ref 70–99)
Potassium: 3.8 mmol/L (ref 3.5–5.1)
SODIUM: 137 mmol/L (ref 135–145)

## 2018-11-18 LAB — URINE CULTURE: Culture: >=100000 — AB

## 2018-11-18 MED ORDER — CEPHALEXIN 500 MG PO CAPS: 500.0000 mg | ORAL_CAPSULE | Freq: Three times a day (TID) | ORAL | Status: DC

## 2018-11-18 MED ORDER — CEPHALEXIN 500 MG PO CAPS: 500.0000 mg | ORAL_CAPSULE | Freq: Three times a day (TID) | ORAL | 0 refills | Status: AC

## 2018-11-18 MED ORDER — CEPHALEXIN 500 MG PO CAPS: 500.0000 mg | ORAL_CAPSULE | Freq: Two times a day (BID) | ORAL | Status: DC

## 2018-11-18 MED ORDER — AMLODIPINE BESYLATE 10 MG PO TABS: 10.0000 mg | ORAL_TABLET | Freq: Every day | ORAL | 0 refills | Status: AC

## 2018-11-18 MED ORDER — FOLIC ACID 1 MG PO TABS: 1.0000 mg | ORAL_TABLET | Freq: Every day | ORAL | 0 refills | Status: AC

## 2018-11-18 MED ORDER — AMLODIPINE BESYLATE 10 MG PO TABS
10.0000 mg | ORAL_TABLET | Freq: Every day | ORAL | Status: DC
  Administered 2018-11-18: 10 mg via ORAL
  Filled 2018-11-18: qty 1

## 2018-11-18 NOTE — Care Management Important Message (Signed)
Important Message  Patient Details  Name: Tommy Jensen MRN: [id number] Date of Birth: 07-07-1941   Medicare Important Message Given:  Yes    [place] Fritze 11/18/2018, 9:23 AM

## 2018-11-18 NOTE — Discharge Summary (Signed)
Physician Discharge Summary  Tommy Jensen ZOX:[id number] DOB: Aug 16, 1941 DOA: 11/15/2018  PCP: Georgianne Fick, MD  Admit date: 11/15/2018 Discharge date: 11/18/2018  Admitted From: Home Disposition:  Home  Discharge Condition:Stable CODE STATUS:FULL Diet recommendation: Heart Healthy  Brief/Interim Summary: Patient is a 77-year male with history of chronic alcohol abuse, anxiety, and GERD, hyperlipidemia, hypertension, seizure disorder not on seizure meds, hyperlipidemia who presents to the emergency department with nonspecific complaints including chest pain, sore throat, generalized malaise, decreased energy, increased frequency of urination and mild dysuria.  Patient was found to have mild fever on presentation with elevated white cell counts.  Urine was suggestive of urine tract infection.  Also found to have acute kidney injury.  His blood cultures also showed E. coli which is pretty pansensitive.   Currently he is hemodynamically stable, afebrile.  He is stable for discharge to home with oral antibiotics.  Following problems were addressed during his hospitalization:  Gram-negative bacteremia/sepsis/urinary tract infection: Presented with fever, increased frequency and dysuria.  Urinalysis suggestive of urinary tract infection.  Blood cultures showed sensitive E. Coli. Currently is hemodynamically stable.  Afebrile. Also has leukocytosis which is  improving.  Continue Keflex for 11 more days to complete 14 days course.  Acute kidney injury: Improving.Current baseline creatinine is unknown but his creatinine on 06/30/2017 was normal. Normal ultrasound of the kidneys.Urine sodium is 19 suggesting prerenal AKI.  Encouraged to drink plenty of fluids at home.  Dysphagia: Complains of difficulty swallowing food,mainly odynophagia.  Has been improving since hospitalization.  Speech therapy was following.  Chest x-ray on presentation did not show pneumonia and pulmonary edema.  Chronic  alcoholism: Drinks half liter of hard liquor every day.  Counseled for cessation.  Social worker facilitated  rehabilitation services.  Continue thiamine ,folic acid.    Hypokalemia:  Supplemented.  Thrombocytopenia: Most likely secondary to chronic alcoholism.  We will continue to monitor.  Lactic acidosis: Resolved with IV fluids  Hyperlipidemia: Continue statin  Deconditioning/debility: Physical therapy evaluated the patient.  Did not recommend anything specific.  Discharge Diagnoses:  Principal Problem:   UTI (urinary tract infection) Active Problems:   Hypercholesteremia   Essential hypertension   Hypokalemia   Alcoholism (HCC)   Thrombocytopenia (HCC)   Leucocytosis   Lactic acidosis   ARF (acute renal failure) (HCC)    Discharge Instructions  Discharge Instructions    Diet - low sodium heart healthy   Complete by:  As directed    Discharge instructions   Complete by:  As directed    1)Please take prescribed medications as instructed. 2)Follow up with your PCP in a week.  Do a CBC and BMP test during the follow-up. 3)Take plenty of  fluids at home.  Please stop alcohol.   Increase activity slowly   Complete by:  As directed      Allergies as of 11/18/2018      Reactions   Ace Inhibitors Nausea And Vomiting   Augmentin [amoxicillin-pot Clavulanate] Nausea And Vomiting   Wellbutrin [bupropion] Nausea And Vomiting      Medication List    STOP taking these medications   olmesartan 40 MG tablet Commonly known as:  BENICAR     TAKE these medications   acetaminophen 325 MG tablet Commonly known as:  TYLENOL Take 325 mg by mouth every 6 (six) hours as needed for moderate pain or headache.   amLODipine 10 MG tablet Commonly known as:  NORVASC Take 1 tablet (10 mg total) by mouth daily. Start  taking on:  November 19, 2018   aspirin 81 MG EC tablet Take 1 tablet (81 mg total) by mouth daily.   cephALEXin 500 MG capsule Commonly known as:  KEFLEX Take 1  capsule (500 mg total) by mouth every 8 (eight) hours for 11 days.   cholecalciferol 25 MCG (1000 UT) tablet Commonly known as:  VITAMIN D3 Take 2,000 Units by mouth daily.   DORZOLAMIDE HCL-TIMOLOL MAL OP Place 1 drop into the right eye 2 (two) times daily.   folic acid 1 MG tablet Commonly known as:  FOLVITE Take 1 tablet (1 mg total) by mouth daily for 30 days.   latanoprost 0.005 % ophthalmic solution Commonly known as:  XALATAN Place 1 drop into both eyes daily.   magnesium oxide 400 (241.3 Mg) MG tablet Commonly known as:  MAG-OX Take 1 tablet (400 mg total) by mouth daily.   thiamine 100 MG tablet Commonly known as:  VITAMIN B-1 Take 100 mg by mouth daily.   vitamin B-12 1000 MCG tablet Commonly known as:  CYANOCOBALAMIN Take 1,000 mcg by mouth daily.   ZINC 15 PO Take by mouth.      Follow-up Information    Georgianne Fick, MD. Schedule an appointment as soon as possible for a visit in 1 week(s).   Specialty:  Internal Medicine Contact information: [address] [place] [address] [phone]          Allergies  Allergen Reactions  . Ace Inhibitors Nausea And Vomiting  . Augmentin [Amoxicillin-Pot Clavulanate] Nausea And Vomiting  . Wellbutrin [Bupropion] Nausea And Vomiting    Consultations:  None   Procedures/Studies: Dg Chest 2 View  Result Date: 11/15/2018 CLINICAL DATA:  Chest pain and dizziness EXAM: CHEST - 2 VIEW COMPARISON:  05/09/2017 FINDINGS: Thoracic aortic calcifications are again seen and stable. Cardiac shadow is within normal limits. No focal infiltrate or sizable effusion is seen. No acute bony abnormality is noted. IMPRESSION: No acute abnormality noted. Electronically Signed   By: Alcide Clever M.D.   On: 11/15/2018 14:15   US Renal  Result Date: 11/16/2018 CLINICAL DATA:  Acute kidney injury EXAM: RENAL / URINARY TRACT ULTRASOUND COMPLETE COMPARISON:  13.1 x 6.6 x 6.4 cm FINDINGS: Right Kidney: Renal  measurements: 13.1 x 6.6 x 6.4 cm. = volume: 287 mL. Normal cortical thickness. Upper normal cortical echogenicity. No mass, hydronephrosis or shadowing calcification. Left Kidney: Renal measurements: 13.4 x 6.6 x 5.6 cm = volume: 261 mL. Normal cortical thickness. Upper normal cortical echogenicity. No mass, hydronephrosis or shadowing calcification. Bladder: Partially distended, grossly unremarkable. IMPRESSION: Negative renal ultrasound. Electronically Signed   By: Ulyses Southward M.D.   On: 11/16/2018 17:30      Subjective: Patient seen and examined the bedside this morning.  Remains comfortable.  Hemodynamically stable for discharge.  Discharge Exam: Vitals:   11/17/18 2002 11/18/18 0609  BP: (!) 159/88 (!) 159/91  Pulse: 97 82  Resp: 19 18  Temp: 98.9 F (37.2 C) 97.7 F (36.5 C)  SpO2: 94% 95%   Vitals:   11/17/18 0540 11/17/18 1414 11/17/18 2002 11/18/18 0609  BP: (!) 142/87 (!) 165/107 (!) 159/88 (!) 159/91  Pulse: 89 85 97 82  Resp: 20 20 19 18   Temp: 98.6 F (37 C) 98.9 F (37.2 C) 98.9 F (37.2 C) 97.7 F (36.5 C)  TempSrc: Oral  Oral Oral  SpO2: 97% 100% 94% 95%  Weight:      Height:  General: Pt is alert, awake, not in acute distress Cardiovascular: RRR, S1/S2 +, no rubs, no gallops Respiratory: CTA bilaterally, no wheezing, no rhonchi Abdominal: Soft, NT, ND, bowel sounds + Extremities: no edema, no cyanosis    The results of significant diagnostics from this hospitalization (including imaging, microbiology, ancillary and laboratory) are listed below for reference.     Microbiology: Recent Results (from the past 240 hour(s))  Urine culture     Status: Abnormal   Collection Time: 11/15/18  3:53 PM  Result Value Ref Range Status   Specimen Description   Final    URINE, RANDOM Performed at [facility], [address] [place], [place] [address]    Special Requests   Final    NONE Performed at [facility],  [address], [place], [place] [address]    Culture >=100,000 COLONIES/mL ESCHERICHIA COLI (A)  Final   Report Status 11/18/2018 FINAL  Final   Organism ID, Bacteria ESCHERICHIA COLI (A)  Final      Susceptibility   Escherichia coli - MIC*    AMPICILLIN >=32 RESISTANT Resistant     CEFAZOLIN <=4 SENSITIVE Sensitive     CEFTRIAXONE <=1 SENSITIVE Sensitive     CIPROFLOXACIN <=0.25 SENSITIVE Sensitive     GENTAMICIN <=1 SENSITIVE Sensitive     IMIPENEM <=0.25 SENSITIVE Sensitive     NITROFURANTOIN <=16 SENSITIVE Sensitive     TRIMETH/SULFA <=20 SENSITIVE Sensitive     AMPICILLIN/SULBACTAM >=32 RESISTANT Resistant     PIP/TAZO <=4 SENSITIVE Sensitive     Extended ESBL NEGATIVE Sensitive     * >=100,000 COLONIES/mL ESCHERICHIA COLI  Blood culture (routine x 2)     Status: Abnormal   Collection Time: 11/15/18  3:53 PM  Result Value Ref Range Status   Specimen Description   Final    BLOOD RIGHT WRIST Performed at [facility], [address], [place], [place] [address]    Special Requests   Final    BOTTLES DRAWN AEROBIC AND ANAEROBIC Blood Culture results may not be optimal due to an inadequate volume of blood received in culture bottles Performed at [facility], [address] [place], [place] [address]    Culture  Setup Time   Final    GRAM NEGATIVE RODS IN BOTH AEROBIC AND ANAEROBIC BOTTLES CRITICAL RESULT CALLED TO, READ BACK BY AND VERIFIED WITH: PHRMD N GLOGOVAC  11/16/2018 BY S GEZAHEGN Performed at [facility], [address], [place], [place] [address]    Culture ESCHERICHIA COLI (A)  Final   Report Status 11/18/2018 FINAL  Final   Organism ID, Bacteria ESCHERICHIA COLI  Final      Susceptibility   Escherichia coli - MIC*    AMPICILLIN >=32 RESISTANT Resistant     CEFAZOLIN <=4 SENSITIVE Sensitive     CEFEPIME <=1 SENSITIVE Sensitive     CEFTAZIDIME <=1 SENSITIVE Sensitive     CEFTRIAXONE <=1 SENSITIVE Sensitive      CIPROFLOXACIN <=0.25 SENSITIVE Sensitive     GENTAMICIN <=1 SENSITIVE Sensitive     IMIPENEM <=0.25 SENSITIVE Sensitive     TRIMETH/SULFA <=20 SENSITIVE Sensitive     AMPICILLIN/SULBACTAM >=32 RESISTANT Resistant     PIP/TAZO <=4 SENSITIVE Sensitive     Extended ESBL NEGATIVE Sensitive     * ESCHERICHIA COLI  Blood Culture ID Panel (Reflexed)     Status: Abnormal   Collection Time: 11/15/18  3:53 PM  Result Value Ref Range Status  Enterococcus species NOT DETECTED NOT DETECTED Final   Listeria monocytogenes NOT DETECTED NOT DETECTED Final   Staphylococcus species NOT DETECTED NOT DETECTED Final   Staphylococcus aureus (BCID) NOT DETECTED NOT DETECTED Final   Streptococcus species NOT DETECTED NOT DETECTED Final   Streptococcus agalactiae NOT DETECTED NOT DETECTED Final   Streptococcus pneumoniae NOT DETECTED NOT DETECTED Final   Streptococcus pyogenes NOT DETECTED NOT DETECTED Final   Acinetobacter baumannii NOT DETECTED NOT DETECTED Final   Enterobacteriaceae species DETECTED (A) NOT DETECTED Final    Comment: Enterobacteriaceae represent a large family of gram-negative bacteria, not a single organism. CRITICAL RESULT CALLED TO, READ BACK BY AND VERIFIED WITH: PHRM N GLOGOVAC @0843  11/16/2018 BY S GEZAHEGN    Enterobacter cloacae complex NOT DETECTED NOT DETECTED Final   Escherichia coli DETECTED (A) NOT DETECTED Final    Comment: CRITICAL RESULT CALLED TO, READ BACK BY AND VERIFIED WITH: PHRM N GLOGOVAC @0843  11/16/2018 BY S GEZAHEGN    Klebsiella oxytoca NOT DETECTED NOT DETECTED Final   Klebsiella pneumoniae NOT DETECTED NOT DETECTED Final   Proteus species NOT DETECTED NOT DETECTED Final   Serratia marcescens NOT DETECTED NOT DETECTED Final   Carbapenem resistance NOT DETECTED NOT DETECTED Final   Haemophilus influenzae NOT DETECTED NOT DETECTED Final   Neisseria meningitidis NOT DETECTED NOT DETECTED Final   Pseudomonas aeruginosa NOT DETECTED NOT DETECTED Final   Candida  albicans NOT DETECTED NOT DETECTED Final   Candida glabrata NOT DETECTED NOT DETECTED Final   Candida krusei NOT DETECTED NOT DETECTED Final   Candida parapsilosis NOT DETECTED NOT DETECTED Final   Candida tropicalis NOT DETECTED NOT DETECTED Final    Comment: Performed at [facility], [address], [place], [place] [address]  Blood culture (routine x 2)     Status: Abnormal   Collection Time: 11/15/18  3:55 PM  Result Value Ref Range Status   Specimen Description   Final    BLOOD LEFT ANTECUBITAL Performed at [facility], [address] [place], [place] [address]    Special Requests   Final    BOTTLES DRAWN AEROBIC AND ANAEROBIC Blood Culture adequate volume Performed at [facility], [address], [place], [place] [address]    Culture  Setup Time   Final    GRAM NEGATIVE RODS IN BOTH AEROBIC AND ANAEROBIC BOTTLES CRITICAL VALUE NOTED.  VALUE IS CONSISTENT WITH PREVIOUSLY REPORTED AND CALLED VALUE. Performed at [facility], [address] [place], [place] [address]    Culture ESCHERICHIA COLI (A)  Final   Report Status 11/18/2018 FINAL  Final  Group A Strep by PCR     Status: None   Collection Time: 11/15/18  5:10 PM  Result Value Ref Range Status   Group A Strep by PCR NOT DETECTED NOT DETECTED Final    Comment: Performed at [facility], [address] [place], [place] [address]  Urine culture     Status: Abnormal   Collection Time: 11/16/18  5:34 AM  Result Value Ref Range Status   Specimen Description   Final    URINE, CLEAN CATCH Performed at [facility], [address], [place], [place] [address]    Special Requests   Final    NONE Performed at [facility], [address], [place], [place] [address]    Culture >=100,000 COLONIES/mL ESCHERICHIA COLI (A)  Final   Report Status 11/18/2018 FINAL  Final  Organism ID, Bacteria ESCHERICHIA COLI (A)  Final       Susceptibility   Escherichia coli - MIC*    AMPICILLIN >=32 RESISTANT Resistant     CEFAZOLIN <=4 SENSITIVE Sensitive     CEFTRIAXONE <=1 SENSITIVE Sensitive     CIPROFLOXACIN <=0.25 SENSITIVE Sensitive     GENTAMICIN <=1 SENSITIVE Sensitive     IMIPENEM <=0.25 SENSITIVE Sensitive     NITROFURANTOIN <=16 SENSITIVE Sensitive     TRIMETH/SULFA <=20 SENSITIVE Sensitive     AMPICILLIN/SULBACTAM 16 INTERMEDIATE Intermediate     PIP/TAZO <=4 SENSITIVE Sensitive     Extended ESBL NEGATIVE Sensitive     * >=100,000 COLONIES/mL ESCHERICHIA COLI     Labs: BNP (last 3 results) No results for input(s): BNP in the last 8760 hours. Basic Metabolic Panel: Recent Labs  Lab 11/15/18 1314 11/16/18 0702 11/17/18 0614 11/18/18 0808  NA 135 135 137 137  K 3.0* 3.5 3.6 3.8  CL 98 103 110 111  CO2 22 20* 18* 20*  GLUCOSE 210* 152* 119* 126*  BUN 50* 58* 51* 39*  CREATININE 2.12* 2.41* 1.89* 1.74*  CALCIUM 9.0 8.2* 7.9* 8.3*   Liver Function Tests: Recent Labs  Lab 11/15/18 1555 11/16/18 0702  AST 26 19  ALT 14 13  ALKPHOS 87 80  BILITOT 1.1 1.1  PROT 7.8 6.8  ALBUMIN 3.7 3.2*   Recent Labs  Lab 11/15/18 1555  LIPASE 40   No results for input(s): AMMONIA in the last 168 hours. CBC: Recent Labs  Lab 11/15/18 1314 11/16/18 0702 11/17/18 0614 11/18/18 0808  WBC 28.7* 17.2* 16.0* 14.2*  NEUTROABS  --   --  13.7* 10.5*  HGB 12.2* 11.0* 10.2* 10.9*  HCT 36.8* 33.7* 31.8* 33.4*  MCV 97.6 100.3* 100.0 100.0  PLT 144* 103* 102* 126*   Cardiac Enzymes: No results for input(s): CKTOTAL, CKMB, CKMBINDEX, TROPONINI in the last 168 hours. BNP: Invalid input(s): POCBNP CBG: No results for input(s): GLUCAP in the last 168 hours. D-Dimer No results for input(s): DDIMER in the last 72 hours. Hgb A1c No results for input(s): HGBA1C in the last 72 hours. Lipid Profile No results for input(s): CHOL, HDL, LDLCALC, TRIG, CHOLHDL, LDLDIRECT in the last 72 hours. Thyroid function  studies No results for input(s): TSH, T4TOTAL, T3FREE, THYROIDAB in the last 72 hours.  Invalid input(s): FREET3 Anemia work up No results for input(s): VITAMINB12, FOLATE, FERRITIN, TIBC, IRON, RETICCTPCT in the last 72 hours. Urinalysis    Component Value Date/Time   COLORURINE AMBER (A) 11/15/2018 1553   APPEARANCEUR CLOUDY (A) 11/15/2018 1553   LABSPEC 1.016 11/15/2018 1553   PHURINE 5.0 11/15/2018 1553   GLUCOSEU NEGATIVE 11/15/2018 1553   HGBUR MODERATE (A) 11/15/2018 1553   BILIRUBINUR NEGATIVE 11/15/2018 1553   KETONESUR NEGATIVE 11/15/2018 1553   PROTEINUR >=300 (A) 11/15/2018 1553   NITRITE NEGATIVE 11/15/2018 1553   LEUKOCYTESUR SMALL (A) 11/15/2018 1553   Sepsis Labs Invalid input(s): PROCALCITONIN,  WBC,  LACTICIDVEN Microbiology Recent Results (from the past 240 hour(s))  Urine culture     Status: Abnormal   Collection Time: 11/15/18  3:53 PM  Result Value Ref Range Status   Specimen Description   Final    URINE, RANDOM Performed at [facility], [address] [place], [place] [address]    Special Requests   Final    NONE Performed at [facility], [address] [place], [place] [id number]    Culture >=100,000 COLONIES/mL ESCHERICHIA  COLI (A)  Final   Report Status 11/18/2018 FINAL  Final   Organism ID, Bacteria ESCHERICHIA COLI (A)  Final      Susceptibility   Escherichia coli - MIC*    AMPICILLIN >=32 RESISTANT Resistant     CEFAZOLIN <=4 SENSITIVE Sensitive     CEFTRIAXONE <=1 SENSITIVE Sensitive     CIPROFLOXACIN <=0.25 SENSITIVE Sensitive     GENTAMICIN <=1 SENSITIVE Sensitive     IMIPENEM <=0.25 SENSITIVE Sensitive     NITROFURANTOIN <=16 SENSITIVE Sensitive     TRIMETH/SULFA <=20 SENSITIVE Sensitive     AMPICILLIN/SULBACTAM >=32 RESISTANT Resistant     PIP/TAZO <=4 SENSITIVE Sensitive     Extended ESBL NEGATIVE Sensitive     * >=100,000 COLONIES/mL ESCHERICHIA COLI  Blood culture (routine x 2)      Status: Abnormal   Collection Time: 11/15/18  3:53 PM  Result Value Ref Range Status   Specimen Description   Final    BLOOD RIGHT WRIST Performed at [facility], [address] [place], [place] [address]    Special Requests   Final    BOTTLES DRAWN AEROBIC AND ANAEROBIC Blood Culture results may not be optimal due to an inadequate volume of blood received in culture bottles Performed at [facility], [address], [place], [place] [address]    Culture  Setup Time   Final    GRAM NEGATIVE RODS IN BOTH AEROBIC AND ANAEROBIC BOTTLES CRITICAL RESULT CALLED TO, READ BACK BY AND VERIFIED WITH: PHRMD N GLOGOVAC @0843  11/16/2018 BY S GEZAHEGN Performed at [facility], [address] [place], [place] [address]    Culture ESCHERICHIA COLI (A)  Final   Report Status 11/18/2018 FINAL  Final   Organism ID, Bacteria ESCHERICHIA COLI  Final      Susceptibility   Escherichia coli - MIC*    AMPICILLIN >=32 RESISTANT Resistant     CEFAZOLIN <=4 SENSITIVE Sensitive     CEFEPIME <=1 SENSITIVE Sensitive     CEFTAZIDIME <=1 SENSITIVE Sensitive     CEFTRIAXONE <=1 SENSITIVE Sensitive     CIPROFLOXACIN <=0.25 SENSITIVE Sensitive     GENTAMICIN <=1 SENSITIVE Sensitive     IMIPENEM <=0.25 SENSITIVE Sensitive     TRIMETH/SULFA <=20 SENSITIVE Sensitive     AMPICILLIN/SULBACTAM >=32 RESISTANT Resistant     PIP/TAZO <=4 SENSITIVE Sensitive     Extended ESBL NEGATIVE Sensitive     * ESCHERICHIA COLI  Blood Culture ID Panel (Reflexed)     Status: Abnormal   Collection Time: 11/15/18  3:53 PM  Result Value Ref Range Status   Enterococcus species NOT DETECTED NOT DETECTED Final   Listeria monocytogenes NOT DETECTED NOT DETECTED Final   Staphylococcus species NOT DETECTED NOT DETECTED Final   Staphylococcus aureus (BCID) NOT DETECTED NOT DETECTED Final   Streptococcus species NOT DETECTED NOT DETECTED Final   Streptococcus agalactiae NOT DETECTED NOT  DETECTED Final   Streptococcus pneumoniae NOT DETECTED NOT DETECTED Final   Streptococcus pyogenes NOT DETECTED NOT DETECTED Final   Acinetobacter baumannii NOT DETECTED NOT DETECTED Final   Enterobacteriaceae species DETECTED (A) NOT DETECTED Final    Comment: Enterobacteriaceae represent a large family of gram-negative bacteria, not a single organism. CRITICAL RESULT CALLED TO, READ BACK BY AND VERIFIED WITH: PHRM N GLOGOVAC @0843  11/16/2018 BY S GEZAHEGN    Enterobacter cloacae complex NOT DETECTED NOT DETECTED Final   Escherichia coli DETECTED (A) NOT DETECTED Final    Comment: CRITICAL  RESULT CALLED TO, READ BACK BY AND VERIFIED WITH: PHRM N GLOGOVAC @0843  11/16/2018 BY S GEZAHEGN    Klebsiella oxytoca NOT DETECTED NOT DETECTED Final   Klebsiella pneumoniae NOT DETECTED NOT DETECTED Final   Proteus species NOT DETECTED NOT DETECTED Final   Serratia marcescens NOT DETECTED NOT DETECTED Final   Carbapenem resistance NOT DETECTED NOT DETECTED Final   Haemophilus influenzae NOT DETECTED NOT DETECTED Final   Neisseria meningitidis NOT DETECTED NOT DETECTED Final   Pseudomonas aeruginosa NOT DETECTED NOT DETECTED Final   Candida albicans NOT DETECTED NOT DETECTED Final   Candida glabrata NOT DETECTED NOT DETECTED Final   Candida krusei NOT DETECTED NOT DETECTED Final   Candida parapsilosis NOT DETECTED NOT DETECTED Final   Candida tropicalis NOT DETECTED NOT DETECTED Final    Comment: Performed at [facility], [address] [place], [place] [address]  Blood culture (routine x 2)     Status: Abnormal   Collection Time: 11/15/18  3:55 PM  Result Value Ref Range Status   Specimen Description   Final    BLOOD LEFT ANTECUBITAL Performed at [facility], [address] [place], [place] [address]    Special Requests   Final    BOTTLES DRAWN AEROBIC AND ANAEROBIC Blood Culture adequate volume Performed at [facility], [address],  [place], [place] [address]    Culture  Setup Time   Final    GRAM NEGATIVE RODS IN BOTH AEROBIC AND ANAEROBIC BOTTLES CRITICAL VALUE NOTED.  VALUE IS CONSISTENT WITH PREVIOUSLY REPORTED AND CALLED VALUE. Performed at [facility], [address] [place], [place] [address]    Culture ESCHERICHIA COLI (A)  Final   Report Status 11/18/2018 FINAL  Final  Group A Strep by PCR     Status: None   Collection Time: 11/15/18  5:10 PM  Result Value Ref Range Status   Group A Strep by PCR NOT DETECTED NOT DETECTED Final    Comment: Performed at [facility], [address], [place], [place] [address]  Urine culture     Status: Abnormal   Collection Time: 11/16/18  5:34 AM  Result Value Ref Range Status   Specimen Description   Final    URINE, CLEAN CATCH Performed at [facility], [address] [place], [place] [address]    Special Requests   Final    NONE Performed at [facility], [address] [place], [place] [address]    Culture >=100,000 COLONIES/mL ESCHERICHIA COLI (A)  Final   Report Status 11/18/2018 FINAL  Final   Organism ID, Bacteria ESCHERICHIA COLI (A)  Final      Susceptibility   Escherichia coli - MIC*    AMPICILLIN >=32 RESISTANT Resistant     CEFAZOLIN <=4 SENSITIVE Sensitive     CEFTRIAXONE <=1 SENSITIVE Sensitive     CIPROFLOXACIN <=0.25 SENSITIVE Sensitive     GENTAMICIN <=1 SENSITIVE Sensitive     IMIPENEM <=0.25 SENSITIVE Sensitive     NITROFURANTOIN <=16 SENSITIVE Sensitive     TRIMETH/SULFA <=20 SENSITIVE Sensitive     AMPICILLIN/SULBACTAM 16 INTERMEDIATE Intermediate     PIP/TAZO <=4 SENSITIVE Sensitive     Extended ESBL NEGATIVE Sensitive     * >=100,000 COLONIES/mL ESCHERICHIA COLI    Please note: You were cared for by a hospitalist during your hospital stay. Once you are discharged, your primary care physician will handle any further medical issues. Please  note that NO REFILLS for any  discharge medications will be authorized once you are discharged, as it is imperative that you return to your primary care physician (or establish a relationship with a primary care physician if you do not have one) for your post hospital discharge needs so that they can reassess your need for medications and monitor your lab values.    Time coordinating discharge: 40 minutes  SIGNED:   Burnadette Pop, MD  [facility] 11/18/2018, 11:36 AM Pager [phone]  If 7PM-7AM, please contact night-coverage [email or web address] Password [email or web address]

## 2018-11-22 DIAGNOSIS — Z23 Encounter for immunization: Secondary | ICD-10-CM | POA: Diagnosis not present

## 2018-11-22 DIAGNOSIS — N390 Urinary tract infection, site not specified: Secondary | ICD-10-CM | POA: Diagnosis not present

## 2018-11-22 DIAGNOSIS — A4151 Sepsis due to Escherichia coli [E. coli]: Secondary | ICD-10-CM | POA: Diagnosis not present

## 2018-11-22 DIAGNOSIS — R1310 Dysphagia, unspecified: Secondary | ICD-10-CM | POA: Diagnosis not present

## 2018-11-22 DIAGNOSIS — D696 Thrombocytopenia, unspecified: Secondary | ICD-10-CM | POA: Diagnosis not present

## 2018-11-22 DIAGNOSIS — Z09 Encounter for follow-up examination after completed treatment for conditions other than malignant neoplasm: Secondary | ICD-10-CM | POA: Diagnosis not present

## 2018-12-23 DIAGNOSIS — Z0000 Encounter for general adult medical examination without abnormal findings: Secondary | ICD-10-CM | POA: Diagnosis not present

## 2018-12-23 DIAGNOSIS — R55 Syncope and collapse: Secondary | ICD-10-CM | POA: Diagnosis not present

## 2018-12-23 DIAGNOSIS — I10 Essential (primary) hypertension: Secondary | ICD-10-CM | POA: Diagnosis not present

## 2018-12-23 DIAGNOSIS — E782 Mixed hyperlipidemia: Secondary | ICD-10-CM | POA: Diagnosis not present

## 2018-12-30 ENCOUNTER — Other Ambulatory Visit: Payer: Self-pay | Admitting: Cardiology

## 2018-12-30 DIAGNOSIS — I10 Essential (primary) hypertension: Secondary | ICD-10-CM | POA: Diagnosis not present

## 2018-12-30 DIAGNOSIS — N183 Chronic kidney disease, stage 3 (moderate): Secondary | ICD-10-CM | POA: Diagnosis not present

## 2018-12-30 DIAGNOSIS — I129 Hypertensive chronic kidney disease with stage 1 through stage 4 chronic kidney disease, or unspecified chronic kidney disease: Secondary | ICD-10-CM | POA: Diagnosis not present

## 2018-12-30 DIAGNOSIS — I6523 Occlusion and stenosis of bilateral carotid arteries: Secondary | ICD-10-CM

## 2018-12-30 DIAGNOSIS — E782 Mixed hyperlipidemia: Secondary | ICD-10-CM | POA: Diagnosis not present

## 2019-01-25 DIAGNOSIS — Z961 Presence of intraocular lens: Secondary | ICD-10-CM | POA: Diagnosis not present

## 2019-01-25 DIAGNOSIS — H401121 Primary open-angle glaucoma, left eye, mild stage: Secondary | ICD-10-CM | POA: Diagnosis not present

## 2019-01-25 DIAGNOSIS — H401113 Primary open-angle glaucoma, right eye, severe stage: Secondary | ICD-10-CM | POA: Diagnosis not present

## 2019-01-27 DIAGNOSIS — H401121 Primary open-angle glaucoma, left eye, mild stage: Secondary | ICD-10-CM | POA: Diagnosis not present

## 2019-01-27 DIAGNOSIS — H401113 Primary open-angle glaucoma, right eye, severe stage: Secondary | ICD-10-CM | POA: Diagnosis not present

## 2019-01-27 DIAGNOSIS — Z961 Presence of intraocular lens: Secondary | ICD-10-CM | POA: Diagnosis not present

## 2019-01-27 DIAGNOSIS — H338 Other retinal detachments: Secondary | ICD-10-CM | POA: Diagnosis not present

## 2019-02-17 DIAGNOSIS — I10 Essential (primary) hypertension: Secondary | ICD-10-CM | POA: Diagnosis not present

## 2019-02-17 DIAGNOSIS — E782 Mixed hyperlipidemia: Secondary | ICD-10-CM | POA: Diagnosis not present

## 2019-02-18 DIAGNOSIS — R350 Frequency of micturition: Secondary | ICD-10-CM | POA: Diagnosis not present

## 2019-02-24 DIAGNOSIS — D508 Other iron deficiency anemias: Secondary | ICD-10-CM | POA: Diagnosis not present

## 2019-02-24 DIAGNOSIS — E782 Mixed hyperlipidemia: Secondary | ICD-10-CM | POA: Diagnosis not present

## 2019-02-24 DIAGNOSIS — N183 Chronic kidney disease, stage 3 (moderate): Secondary | ICD-10-CM | POA: Diagnosis not present

## 2019-02-24 DIAGNOSIS — I10 Essential (primary) hypertension: Secondary | ICD-10-CM | POA: Diagnosis not present

## 2019-02-24 DIAGNOSIS — Z7189 Other specified counseling: Secondary | ICD-10-CM | POA: Diagnosis not present

## 2019-03-21 ENCOUNTER — Other Ambulatory Visit: Payer: Medicare Other

## 2019-03-29 DIAGNOSIS — R350 Frequency of micturition: Secondary | ICD-10-CM | POA: Diagnosis not present

## 2019-04-28 DIAGNOSIS — Z961 Presence of intraocular lens: Secondary | ICD-10-CM | POA: Diagnosis not present

## 2019-04-28 DIAGNOSIS — H401113 Primary open-angle glaucoma, right eye, severe stage: Secondary | ICD-10-CM | POA: Diagnosis not present

## 2019-04-28 DIAGNOSIS — H401121 Primary open-angle glaucoma, left eye, mild stage: Secondary | ICD-10-CM | POA: Diagnosis not present

## 2019-06-23 DIAGNOSIS — N183 Chronic kidney disease, stage 3 unspecified: Secondary | ICD-10-CM | POA: Diagnosis not present

## 2019-06-23 DIAGNOSIS — I10 Essential (primary) hypertension: Secondary | ICD-10-CM | POA: Diagnosis not present

## 2019-06-23 DIAGNOSIS — D508 Other iron deficiency anemias: Secondary | ICD-10-CM | POA: Diagnosis not present

## 2019-06-23 DIAGNOSIS — E782 Mixed hyperlipidemia: Secondary | ICD-10-CM | POA: Diagnosis not present

## 2019-06-30 DIAGNOSIS — I10 Essential (primary) hypertension: Secondary | ICD-10-CM | POA: Diagnosis not present

## 2019-06-30 DIAGNOSIS — Z23 Encounter for immunization: Secondary | ICD-10-CM | POA: Diagnosis not present

## 2019-06-30 DIAGNOSIS — E782 Mixed hyperlipidemia: Secondary | ICD-10-CM | POA: Diagnosis not present

## 2019-06-30 DIAGNOSIS — Z7189 Other specified counseling: Secondary | ICD-10-CM | POA: Diagnosis not present

## 2019-07-01 IMAGING — DX DG FOOT COMPLETE 3+V*L*
3 series · 3 of 3 positions shown · non-contrast
Comparison: No prior.

CLINICAL DATA: Left great toe injury.  Pain, swelling, redness.

EXAM:
LEFT FOOT - COMPLETE 3+ VIEW

[foot ap]
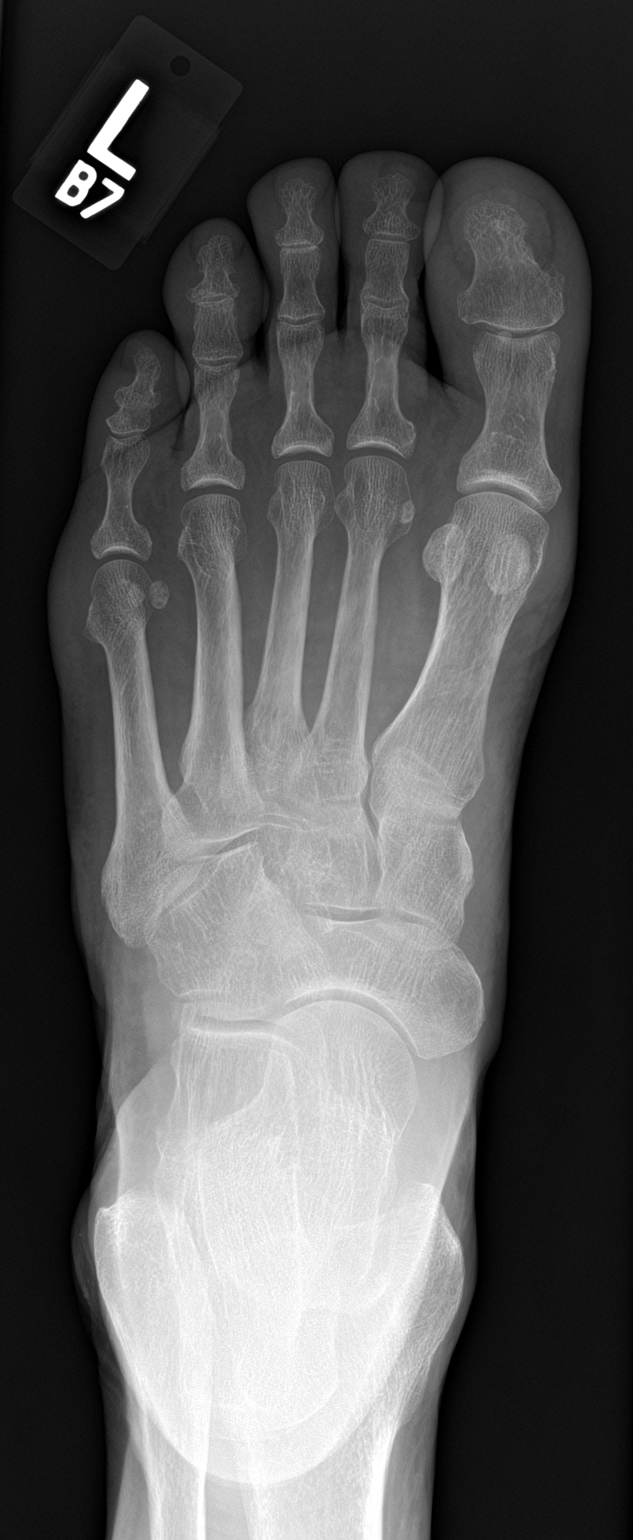

[foot obl]
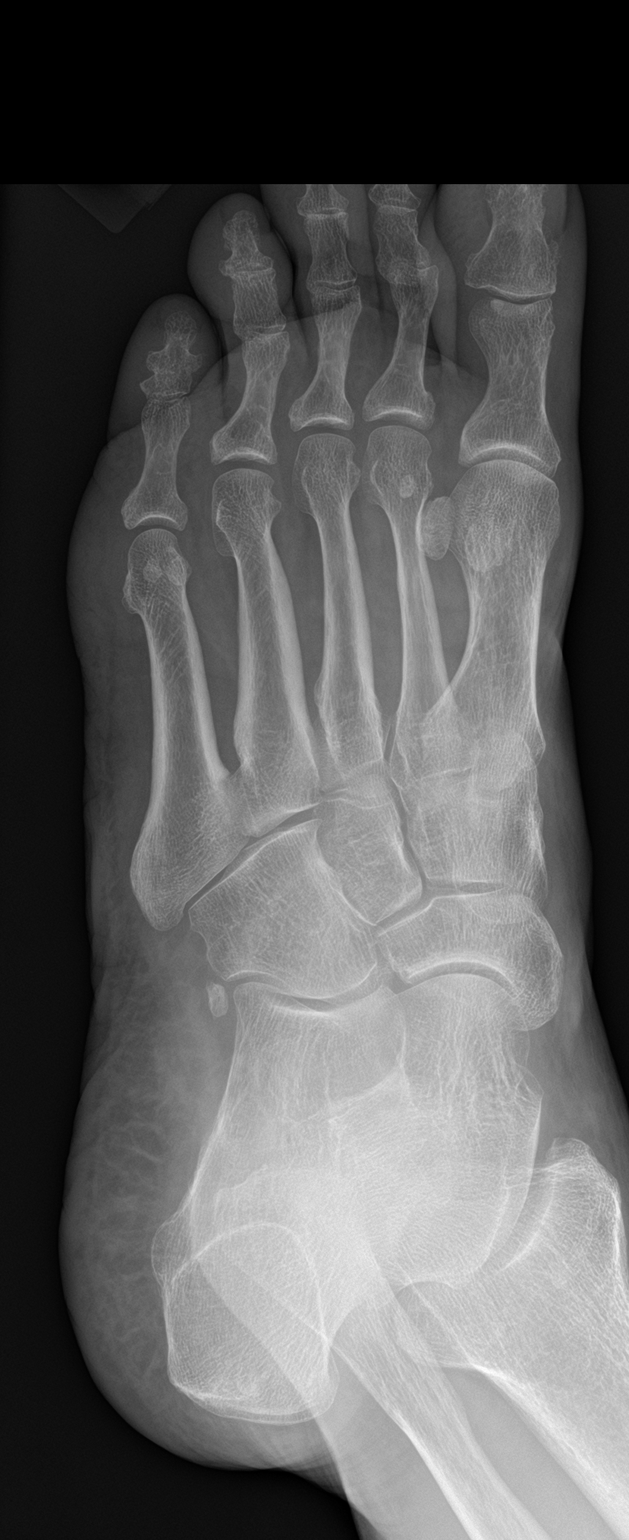

[foot lat]
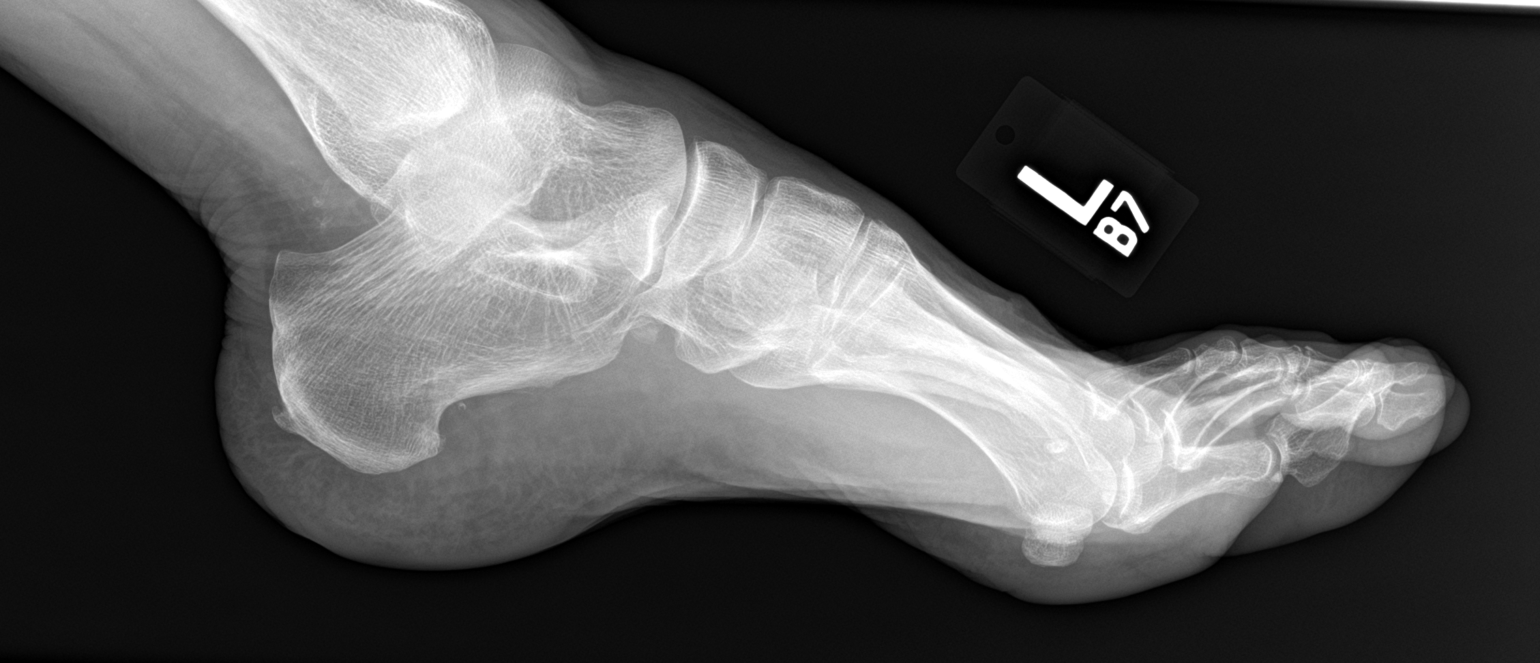

[3 of 3 positions shown; findings below may reference images not displayed]

FINDINGS: No acute bony or joint abnormality identified. No evidence of
fracture dislocation. Peripheral vascular calcification.
IMPRESSION: 1.  No acute bony abnormality.

2.  Peripheral vascular disease.

## 2019-07-21 DIAGNOSIS — K7689 Other specified diseases of liver: Secondary | ICD-10-CM | POA: Diagnosis not present

## 2019-07-27 ENCOUNTER — Other Ambulatory Visit: Payer: Self-pay | Admitting: Internal Medicine

## 2019-07-27 DIAGNOSIS — Z7189 Other specified counseling: Secondary | ICD-10-CM | POA: Diagnosis not present

## 2019-07-27 DIAGNOSIS — N2889 Other specified disorders of kidney and ureter: Secondary | ICD-10-CM | POA: Diagnosis not present

## 2019-08-19 ENCOUNTER — Other Ambulatory Visit: Payer: Self-pay

## 2019-08-19 ENCOUNTER — Ambulatory Visit
Admission: RE | Admit: 2019-08-19 | Discharge: 2019-08-19 | Disposition: A | Discharge status: Home / Self Care | Payer: Medicare Other | Source: Ambulatory Visit | Attending: Internal Medicine | Admitting: Internal Medicine

## 2019-08-19 DIAGNOSIS — N2889 Other specified disorders of kidney and ureter: Secondary | ICD-10-CM

## 2019-08-19 DIAGNOSIS — K5641 Fecal impaction: Secondary | ICD-10-CM | POA: Diagnosis not present

## 2019-08-19 MED ORDER — GADOBENATE DIMEGLUMINE 529 MG/ML IV SOLN
14.0000 mL | Freq: Once | INTRAVENOUS | Status: AC | PRN
  Administered 2019-08-19: 14 mL via INTRAVENOUS

## 2019-08-24 DIAGNOSIS — N1831 Chronic kidney disease, stage 3a: Secondary | ICD-10-CM | POA: Diagnosis not present

## 2019-08-24 DIAGNOSIS — R935 Abnormal findings on diagnostic imaging of other abdominal regions, including retroperitoneum: Secondary | ICD-10-CM | POA: Diagnosis not present

## 2019-08-24 DIAGNOSIS — I10 Essential (primary) hypertension: Secondary | ICD-10-CM | POA: Diagnosis not present

## 2019-10-13 DIAGNOSIS — N390 Urinary tract infection, site not specified: Secondary | ICD-10-CM | POA: Diagnosis not present

## 2019-10-13 DIAGNOSIS — D696 Thrombocytopenia, unspecified: Secondary | ICD-10-CM | POA: Diagnosis not present

## 2019-10-13 DIAGNOSIS — N1831 Chronic kidney disease, stage 3a: Secondary | ICD-10-CM | POA: Diagnosis not present

## 2019-10-13 DIAGNOSIS — I10 Essential (primary) hypertension: Secondary | ICD-10-CM | POA: Diagnosis not present

## 2019-10-13 DIAGNOSIS — R413 Other amnesia: Secondary | ICD-10-CM | POA: Diagnosis not present

## 2019-10-13 DIAGNOSIS — Z0000 Encounter for general adult medical examination without abnormal findings: Secondary | ICD-10-CM | POA: Diagnosis not present

## 2019-10-13 DIAGNOSIS — E782 Mixed hyperlipidemia: Secondary | ICD-10-CM | POA: Diagnosis not present

## 2019-10-20 DIAGNOSIS — Z0000 Encounter for general adult medical examination without abnormal findings: Secondary | ICD-10-CM | POA: Diagnosis not present

## 2019-10-25 DIAGNOSIS — Z961 Presence of intraocular lens: Secondary | ICD-10-CM | POA: Diagnosis not present

## 2019-10-25 DIAGNOSIS — H1132 Conjunctival hemorrhage, left eye: Secondary | ICD-10-CM | POA: Diagnosis not present

## 2019-10-25 DIAGNOSIS — H401121 Primary open-angle glaucoma, left eye, mild stage: Secondary | ICD-10-CM | POA: Diagnosis not present

## 2019-10-25 DIAGNOSIS — H401113 Primary open-angle glaucoma, right eye, severe stage: Secondary | ICD-10-CM | POA: Diagnosis not present

## 2019-10-25 DIAGNOSIS — H338 Other retinal detachments: Secondary | ICD-10-CM | POA: Diagnosis not present

## 2019-11-10 DIAGNOSIS — E782 Mixed hyperlipidemia: Secondary | ICD-10-CM | POA: Diagnosis not present

## 2019-11-13 ENCOUNTER — Ambulatory Visit: Payer: Medicare Other | Attending: Internal Medicine

## 2019-11-13 DIAGNOSIS — Z23 Encounter for immunization: Secondary | ICD-10-CM | POA: Insufficient documentation

## 2019-11-13 NOTE — Progress Notes (Signed)
   Covid-19 Vaccination Clinic  Name:  Tommy Jensen    MRN: [id number] DOB: 27-Jun-1941  11/13/2019  [name] was observed post Covid-19 immunization for 15 minutes without incidence. He was provided with Vaccine Information Sheet and instruction to access the V-Safe system.   [name] was instructed to call 911 with any severe reactions post vaccine: Marland Kitchen Difficulty breathing  . Swelling of your face and throat  . A fast heartbeat  . A bad rash all over your body  . Dizziness and weakness    Immunizations Administered    Name Date Dose VIS Date Route   Pfizer COVID-19 Vaccine 11/13/2019 12:18 PM 0.3 mL 08/26/2019 Intramuscular   Manufacturer: [employer], [employer]   Lot: [id number]   NDC: [address]-7

## 2019-11-15 ENCOUNTER — Other Ambulatory Visit: Payer: Self-pay | Admitting: Internal Medicine

## 2019-11-15 DIAGNOSIS — F17200 Nicotine dependence, unspecified, uncomplicated: Secondary | ICD-10-CM

## 2019-11-25 ENCOUNTER — Inpatient Hospital Stay: Admission: RE | Admit: 2019-11-25 | Payer: Medicare Other | Source: Ambulatory Visit

## 2019-12-01 DIAGNOSIS — E782 Mixed hyperlipidemia: Secondary | ICD-10-CM | POA: Diagnosis not present

## 2019-12-01 DIAGNOSIS — I10 Essential (primary) hypertension: Secondary | ICD-10-CM | POA: Diagnosis not present

## 2019-12-01 DIAGNOSIS — I129 Hypertensive chronic kidney disease with stage 1 through stage 4 chronic kidney disease, or unspecified chronic kidney disease: Secondary | ICD-10-CM | POA: Diagnosis not present

## 2019-12-13 ENCOUNTER — Ambulatory Visit: Payer: Medicare Other | Attending: Internal Medicine

## 2019-12-13 DIAGNOSIS — Z23 Encounter for immunization: Secondary | ICD-10-CM

## 2019-12-13 NOTE — Progress Notes (Signed)
   Covid-19 Vaccination Clinic  Name:  Tommy Jensen    MRN: [id number] DOB: [birth year]  12/13/2019  Tommy Jensen was observed post Covid-19 immunization for 15 minutes without incident. He was provided with Vaccine Information Sheet and instruction to access the V-Safe system.   Tommy Jensen was instructed to call 911 with any severe reactions post vaccine: Marland Kitchen Difficulty breathing  . Swelling of face and throat  . A fast heartbeat  . A bad rash all over body  . Dizziness and weakness   Immunizations Administered    Name Date Dose VIS Date Route   Pfizer COVID-19 Vaccine 12/13/2019  9:27 AM 0.3 mL 08/26/2019 Intramuscular   Manufacturer: [employer], [employer]   Lot: [id number]   NDC: [address]-5

## 2020-04-24 DIAGNOSIS — H401113 Primary open-angle glaucoma, right eye, severe stage: Secondary | ICD-10-CM | POA: Diagnosis not present

## 2020-04-24 DIAGNOSIS — H401121 Primary open-angle glaucoma, left eye, mild stage: Secondary | ICD-10-CM | POA: Diagnosis not present

## 2020-05-17 DIAGNOSIS — I129 Hypertensive chronic kidney disease with stage 1 through stage 4 chronic kidney disease, or unspecified chronic kidney disease: Secondary | ICD-10-CM | POA: Diagnosis not present

## 2020-05-17 DIAGNOSIS — I214 Non-ST elevation (NSTEMI) myocardial infarction: Secondary | ICD-10-CM | POA: Diagnosis not present

## 2020-05-17 DIAGNOSIS — R0602 Shortness of breath: Secondary | ICD-10-CM | POA: Diagnosis not present

## 2020-05-17 DIAGNOSIS — Z515 Encounter for palliative care: Secondary | ICD-10-CM | POA: Diagnosis not present

## 2020-05-17 DIAGNOSIS — I21A1 Myocardial infarction type 2: Secondary | ICD-10-CM | POA: Diagnosis not present

## 2020-05-17 DIAGNOSIS — N3000 Acute cystitis without hematuria: Secondary | ICD-10-CM | POA: Diagnosis not present

## 2020-05-17 DIAGNOSIS — J9601 Acute respiratory failure with hypoxia: Secondary | ICD-10-CM | POA: Diagnosis not present

## 2020-05-17 DIAGNOSIS — N189 Chronic kidney disease, unspecified: Secondary | ICD-10-CM | POA: Diagnosis not present

## 2020-05-17 DIAGNOSIS — Z87891 Personal history of nicotine dependence: Secondary | ICD-10-CM | POA: Diagnosis not present

## 2020-05-17 DIAGNOSIS — E7849 Other hyperlipidemia: Secondary | ICD-10-CM | POA: Diagnosis not present

## 2020-05-17 DIAGNOSIS — N183 Chronic kidney disease, stage 3 unspecified: Secondary | ICD-10-CM | POA: Diagnosis not present

## 2020-05-17 DIAGNOSIS — J8410 Pulmonary fibrosis, unspecified: Secondary | ICD-10-CM | POA: Diagnosis not present

## 2020-05-17 DIAGNOSIS — R0902 Hypoxemia: Secondary | ICD-10-CM | POA: Diagnosis not present

## 2020-05-17 DIAGNOSIS — R6889 Other general symptoms and signs: Secondary | ICD-10-CM | POA: Diagnosis not present

## 2020-05-17 DIAGNOSIS — R0689 Other abnormalities of breathing: Secondary | ICD-10-CM | POA: Diagnosis not present

## 2020-05-17 DIAGNOSIS — N179 Acute kidney failure, unspecified: Secondary | ICD-10-CM | POA: Diagnosis not present

## 2020-05-17 DIAGNOSIS — J810 Acute pulmonary edema: Secondary | ICD-10-CM | POA: Diagnosis not present

## 2020-05-17 DIAGNOSIS — J80 Acute respiratory distress syndrome: Secondary | ICD-10-CM | POA: Diagnosis not present

## 2020-05-17 DIAGNOSIS — R778 Other specified abnormalities of plasma proteins: Secondary | ICD-10-CM | POA: Diagnosis not present

## 2020-05-17 DIAGNOSIS — I248 Other forms of acute ischemic heart disease: Secondary | ICD-10-CM | POA: Diagnosis not present

## 2020-05-17 DIAGNOSIS — Z743 Need for continuous supervision: Secondary | ICD-10-CM | POA: Diagnosis not present

## 2020-05-17 DIAGNOSIS — E870 Hyperosmolality and hypernatremia: Secondary | ICD-10-CM | POA: Diagnosis not present

## 2020-05-17 DIAGNOSIS — R918 Other nonspecific abnormal finding of lung field: Secondary | ICD-10-CM | POA: Diagnosis not present

## 2020-05-17 DIAGNOSIS — R509 Fever, unspecified: Secondary | ICD-10-CM | POA: Diagnosis not present

## 2020-05-17 DIAGNOSIS — Z66 Do not resuscitate: Secondary | ICD-10-CM | POA: Diagnosis not present

## 2020-05-17 DIAGNOSIS — Z79899 Other long term (current) drug therapy: Secondary | ICD-10-CM | POA: Diagnosis not present

## 2020-06-15 DEATH — deceased
